# Patient Record
Sex: Male | Born: 1990 | Race: Black or African American | Hispanic: No | Marital: Single | Smoking: Never smoker
Health system: Southern US, Community
[De-identification: ages and names within clinical notes are randomized; demographics above are authoritative.]

## PROBLEM LIST (undated history)

## (undated) DIAGNOSIS — B019 Varicella without complication: Secondary | ICD-10-CM

## (undated) DIAGNOSIS — J45909 Unspecified asthma, uncomplicated: Secondary | ICD-10-CM

## (undated) HISTORY — PX: FOOT SURGERY: SHX648

## (undated) HISTORY — DX: Varicella without complication: B01.9

## (undated) HISTORY — DX: Unspecified asthma, uncomplicated: J45.909

---

## 2009-02-01 ENCOUNTER — Emergency Department (HOSPITAL_COMMUNITY): Admission: EM | Admit: 2009-02-01 | Discharge: 2009-02-01 | Payer: Self-pay | Admitting: Family Medicine

## 2009-03-10 ENCOUNTER — Encounter: Admission: RE | Admit: 2009-03-10 | Discharge: 2009-03-10 | Payer: Self-pay | Admitting: Family Medicine

## 2010-04-03 ENCOUNTER — Encounter: Payer: Self-pay | Admitting: Family Medicine

## 2010-06-29 ENCOUNTER — Inpatient Hospital Stay (INDEPENDENT_AMBULATORY_CARE_PROVIDER_SITE_OTHER)
Admission: RE | Admit: 2010-06-29 | Discharge: 2010-06-29 | Disposition: A | Discharge status: Home / Self Care | Payer: Federal, State, Local not specified - PPO | Source: Ambulatory Visit | Attending: Family Medicine | Admitting: Family Medicine

## 2010-06-29 DIAGNOSIS — J309 Allergic rhinitis, unspecified: Secondary | ICD-10-CM

## 2010-06-29 DIAGNOSIS — J9801 Acute bronchospasm: Secondary | ICD-10-CM

## 2010-11-21 ENCOUNTER — Inpatient Hospital Stay (INDEPENDENT_AMBULATORY_CARE_PROVIDER_SITE_OTHER)
Admission: RE | Admit: 2010-11-21 | Discharge: 2010-11-21 | Disposition: A | Discharge status: Home / Self Care | Payer: Federal, State, Local not specified - PPO | Source: Ambulatory Visit | Attending: Emergency Medicine | Admitting: Emergency Medicine

## 2010-11-21 DIAGNOSIS — N476 Balanoposthitis: Secondary | ICD-10-CM

## 2011-07-25 ENCOUNTER — Emergency Department (INDEPENDENT_AMBULATORY_CARE_PROVIDER_SITE_OTHER)
Admission: EM | Admit: 2011-07-25 | Discharge: 2011-07-25 | Disposition: A | Discharge status: Home / Self Care | Payer: Federal, State, Local not specified - PPO | Source: Home / Self Care | Attending: Emergency Medicine | Admitting: Emergency Medicine

## 2011-07-25 ENCOUNTER — Encounter (HOSPITAL_COMMUNITY): Payer: Self-pay | Admitting: *Deleted

## 2011-07-25 DIAGNOSIS — A084 Viral intestinal infection, unspecified: Secondary | ICD-10-CM

## 2011-07-25 DIAGNOSIS — A09 Infectious gastroenteritis and colitis, unspecified: Secondary | ICD-10-CM

## 2011-07-25 MED ORDER — ONDANSETRON 8 MG PO TBDP: 8.0000 mg | ORAL_TABLET | Freq: Three times a day (TID) | ORAL | Status: AC | PRN

## 2011-07-25 MED ORDER — BENZONATATE 200 MG PO CAPS: 200.0000 mg | ORAL_CAPSULE | Freq: Three times a day (TID) | ORAL | Status: AC | PRN

## 2011-07-25 MED ORDER — ONDANSETRON 4 MG PO TBDP
ORAL_TABLET | ORAL | Status: AC
  Filled 2011-07-25: qty 2

## 2011-07-25 MED ORDER — ONDANSETRON 4 MG PO TBDP
8.0000 mg | ORAL_TABLET | Freq: Once | ORAL | Status: AC
  Administered 2011-07-25: 8 mg via ORAL

## 2011-07-25 MED ORDER — DIPHENOXYLATE-ATROPINE 2.5-0.025 MG PO TABS: 1.0000 | ORAL_TABLET | Freq: Four times a day (QID) | ORAL | Status: AC | PRN

## 2011-07-25 NOTE — ED Provider Notes (Signed)
Chief Complaint  Patient presents with  . Diarrhea    History of Present Illness:   The patient is a 21 year old male who has had a three-day history of nausea and vomiting with clear vomitus. He's only vomited twice. He denies any blood in the vomitus, coffee-ground material, or bilious emesis. He's had frequent diarrheal stools. These were dark although he had taken some Pepto-Bismol before. No blood in the stools. He also notes a cough productive yellow sputum but no nasal congestion, rhinorrhea, or sore throat. He's had no fever or chills. He's had some crampy intermittent upper abdominal pain. He was exposed to his son this past Friday who had the same thing. He also ate a steak that was medium rare. No other suspicious exposures or ingestions. No recent travel. No recent antibiotics.  Review of Systems:  Other than noted above, the patient denies any of the following symptoms: Systemic:  No fevers, chills, sweats, weight loss or gain, fatigue, or tiredness. ENT:  No nasal congestion, rhinorrhea, or sore throat. Lungs:  No cough, wheezing, or shortness of breath. Cardiac:  No chest pain, syncope, or presyncope. GI:  No abdominal pain, nausea, vomiting, anorexia, diarrhea, constipation, blood in stool or vomitus. GU:  No dysuria, frequency, or urgency.  PMFSH:  Past medical history, family history, social history, meds, and allergies were reviewed.  Physical Exam:   Vital signs:  BP 127/92  Pulse 74  Temp(Src) 98.5 F (36.9 C) (Oral)  Resp 18  SpO2 98% General:  Alert and oriented.  In no distress.  Skin warm and dry.  Good skin turgor, brisk capillary refill. ENT:  No scleral icterus, moist mucous membranes, no oral lesions, pharynx clear. Lungs:  Breath sounds clear and equal bilaterally.  No wheezes, rales, or rhonchi. Heart:  Rhythm regular, without extrasystoles.  No gallops or murmers. Abdomen:  Abdomen was soft, flat, nondistended. There was no tenderness to palpation,  guarding, or rebound. Bowel sounds are hyperactive. No organomegaly or mass. Skin: Clear, warm, and dry.  Good turgor.  Brisk capillary refill.  Course in Urgent Care Center:   He was given Zofran ODT 8 mg sublingually and tolerated this well without any immediate side effects.  Assessment:  The encounter diagnosis was Viral gastroenteritis.  Plan:   1.  The following meds were prescribed:   New Prescriptions   BENZONATATE (TESSALON) 200 MG CAPSULE    Take 1 capsule (200 mg total) by mouth 3 (three) times daily as needed for cough.   DIPHENOXYLATE-ATROPINE (LOMOTIL) 2.5-0.025 MG PER TABLET    Take 1 tablet by mouth 4 (four) times daily as needed for diarrhea or loose stools.   ONDANSETRON (ZOFRAN ODT) 8 MG DISINTEGRATING TABLET    Take 1 tablet (8 mg total) by mouth every 8 (eight) hours as needed for nausea.   2.  The patient was instructed in symptomatic care and handouts were given. 3.  The patient was told to return if becoming worse in any way, if no better in 2 or 3 days, and given some red flag symptoms that would indicate earlier return. 4.  The patient was told to take only sips of clear liquids for the next 24 hours and then advance to a b.r.a.t. Diet.      Reuben Likes, MD 07/25/11 [phone]

## 2011-07-25 NOTE — ED Notes (Signed)
Pt  Reports   Symptoms  Of  Nausea  /  Vomiting  Diarrhea         X   2  Days           Pt  Reports  Symptoms  Of low  abd  Pain  Described  As  Cramping     At  Intervals  Too    -   He  Is  Ambulatory  To  Treatment  Area  Awake  And  Alert  And  or

## 2011-07-25 NOTE — Discharge Instructions (Signed)

## 2011-09-01 ENCOUNTER — Ambulatory Visit: Payer: Federal, State, Local not specified - PPO | Admitting: Family Medicine

## 2012-03-14 ENCOUNTER — Emergency Department (HOSPITAL_COMMUNITY): Payer: Federal, State, Local not specified - PPO

## 2012-03-14 ENCOUNTER — Encounter (HOSPITAL_COMMUNITY): Payer: Self-pay | Admitting: *Deleted

## 2012-03-14 ENCOUNTER — Emergency Department (HOSPITAL_COMMUNITY)
Admission: EM | Admit: 2012-03-14 | Discharge: 2012-03-14 | Disposition: A | Discharge status: Home / Self Care | Payer: Federal, State, Local not specified - PPO | Attending: Emergency Medicine | Admitting: Emergency Medicine

## 2012-03-14 DIAGNOSIS — R1013 Epigastric pain: Secondary | ICD-10-CM | POA: Insufficient documentation

## 2012-03-14 LAB — CBC WITH DIFFERENTIAL/PLATELET
Eosinophils Relative: 2 % (ref 0–5)
HCT: 43.7 % (ref 39.0–52.0)
Lymphocytes Relative: 14 % (ref 12–46)
Lymphs Abs: 1.4 10*3/uL (ref 0.7–4.0)
MCV: 83.4 fL (ref 78.0–100.0)
Monocytes Absolute: 0.6 10*3/uL (ref 0.1–1.0)
Neutro Abs: 8.2 10*3/uL — ABNORMAL HIGH (ref 1.7–7.7)
RBC: 5.24 MIL/uL (ref 4.22–5.81)
WBC: 10.4 10*3/uL (ref 4.0–10.5)

## 2012-03-14 LAB — COMPREHENSIVE METABOLIC PANEL
ALT: 22 U/L (ref 0–53)
AST: 20 U/L (ref 0–37)
BUN: 16 mg/dL (ref 6–23)
CO2: 25 mEq/L (ref 19–32)
Calcium: 9 mg/dL (ref 8.4–10.5)
Chloride: 103 mEq/L (ref 96–112)
Creatinine, Ser: 0.8 mg/dL (ref 0.50–1.35)
GFR calc Af Amer: >90 mL/min (ref >90)
GFR calc non Af Amer: >90 mL/min (ref >90)
Glucose, Bld: 107 mg/dL — ABNORMAL HIGH (ref 70–99)
Sodium: 140 mEq/L (ref 135–145)
Total Bilirubin: 0.3 mg/dL (ref 0.3–1.2)

## 2012-03-14 MED ORDER — ONDANSETRON HCL 4 MG/2ML IJ SOLN
INTRAMUSCULAR | Status: AC
  Filled 2012-03-14: qty 2

## 2012-03-14 MED ORDER — MORPHINE SULFATE 4 MG/ML IJ SOLN
4.0000 mg | Freq: Once | INTRAMUSCULAR | Status: AC
  Administered 2012-03-14: 4 mg via INTRAVENOUS
  Filled 2012-03-14: qty 1

## 2012-03-14 MED ORDER — GI COCKTAIL ~~LOC~~
30.0000 mL | Freq: Once | ORAL | Status: AC
  Administered 2012-03-14: 30 mL via ORAL
  Filled 2012-03-14: qty 30

## 2012-03-14 MED ORDER — OMEPRAZOLE 20 MG PO CPDR: 20.0000 mg | DELAYED_RELEASE_CAPSULE | Freq: Every day | ORAL | Status: DC

## 2012-03-14 MED ORDER — ONDANSETRON HCL 4 MG/2ML IJ SOLN
4.0000 mg | Freq: Once | INTRAMUSCULAR | Status: AC
  Administered 2012-03-14: 4 mg via INTRAVENOUS

## 2012-03-14 MED ORDER — ONDANSETRON 4 MG PO TBDP
4.0000 mg | ORAL_TABLET | Freq: Once | ORAL | Status: AC
  Administered 2012-03-14: 4 mg via ORAL

## 2012-03-14 MED ORDER — HYDROCODONE-ACETAMINOPHEN 5-325 MG PO TABS: 2.0000 | ORAL_TABLET | ORAL | Status: DC | PRN

## 2012-03-14 MED ORDER — ONDANSETRON 4 MG PO TBDP
ORAL_TABLET | ORAL | Status: AC
  Administered 2012-03-14: 4 mg via ORAL
  Filled 2012-03-14: qty 1

## 2012-03-14 MED ORDER — HYDROMORPHONE HCL PF 1 MG/ML IJ SOLN
0.5000 mg | Freq: Once | INTRAMUSCULAR | Status: DC
  Filled 2012-03-14: qty 1

## 2012-03-14 MED ORDER — ONDANSETRON HCL 4 MG/2ML IJ SOLN
4.0000 mg | Freq: Once | INTRAMUSCULAR | Status: DC
  Filled 2012-03-14: qty 2

## 2012-03-14 MED ORDER — FAMOTIDINE 20 MG PO TABS: 20.0000 mg | ORAL_TABLET | Freq: Two times a day (BID) | ORAL | Status: DC

## 2012-03-14 MED ORDER — HYDROMORPHONE HCL PF 1 MG/ML IJ SOLN
1.0000 mg | Freq: Once | INTRAMUSCULAR | Status: AC
  Administered 2012-03-14: 1 mg via INTRAMUSCULAR

## 2012-03-14 NOTE — ED Notes (Signed)
Pt sitting in room he was d/c from. Friend/family member states "he isn't better and he needs to be seen again".

## 2012-03-14 NOTE — ED Provider Notes (Signed)
History     CSN: [id number]  Arrival date & time 03/14/12  0701   First MD Initiated Contact with Patient 03/14/12 [phone]      Chief Complaint  Patient presents with  . Abdominal Pain    (Consider location/radiation/quality/duration/timing/severity/associated sxs/prior treatment) HPI  22 year old male presents complaining of epigastric abdominal pain. Patient reports he was awoke at 4 AM this morning with acute onset of sharp crampy sensation to his epigastrium, moderate in severity, intermittent lasting for minutes, non radiating, nothing makes it better or worse. Patient currently rates his pain as a 7/10. He denies fever, chills, sore throat, chest pain, shortness of breath, back pain, nausea, vomiting, diarrhea, dysuria, or rash. He denies taking any NSAIDs on a regular basis. Denies any hematochezia, or melena. He denies any appetite change, or prior history of biliary disease. Has never had similar symptoms like this before. Pt ate some sausage and squash the night before.  No recent alcohol use.  History reviewed. No pertinent past medical history.  Past Surgical History  Procedure Date  . Foot surgery     History reviewed. No pertinent family history.  History  Substance Use Topics  . Smoking status: Never Smoker   . Smokeless tobacco: Not on file  . Alcohol Use: Yes      Review of Systems  Constitutional:       A complete 10 system review of systems was obtained and all systems are negative except as noted in the HPI and PMH.    Allergies  Review of patient's allergies indicates no known allergies.  Home Medications  No current outpatient prescriptions on file.  BP 130/85  Pulse 72  Temp 97.8 F (36.6 C)  Resp 14  SpO2 98%  Physical Exam  Nursing note and vitals reviewed. Constitutional: He is oriented to person, place, and time. He appears well-developed and well-nourished. No distress.       Awake, alert, nontoxic appearance  HENT:  Head:  Atraumatic.  Eyes: Conjunctivae normal are normal. Right eye exhibits no discharge. Left eye exhibits no discharge.  Neck: Normal range of motion. Neck supple.  Cardiovascular: Normal rate and regular rhythm.   Pulmonary/Chest: Effort normal. No respiratory distress. He exhibits tenderness (mild epigastric tenderness on palpation without guarding or rebound tenderness. No hernia noted. No overlying skin changes noted.).       No CVA tenderness  Negative Murphy sign  No pain at McBurney's point  Abdominal: Soft. There is no tenderness. There is no rebound.  Musculoskeletal: He exhibits no tenderness.       ROM appears intact, no obvious focal weakness  Neurological: He is alert and oriented to person, place, and time.  Skin: Skin is warm and dry. No rash noted.  Psychiatric: He has a normal mood and affect.    ED Course  Procedures (including critical care time)   Labs Reviewed  CBC WITH DIFFERENTIAL  COMPREHENSIVE METABOLIC PANEL  LIPASE, BLOOD   No results found.   No diagnosis found.  Results for orders placed during the hospital encounter of 03/14/12  CBC WITH DIFFERENTIAL      Component Value Range   WBC 10.4  4.0 - 10.5 K/uL   RBC 5.24  4.22 - 5.81 MIL/uL   Hemoglobin 14.9  13.0 - 17.0 g/dL   HCT 11.9  14.7 - 82.9 %   MCV 83.4  78.0 - 100.0 fL   MCH 28.4  26.0 - 34.0 pg   MCHC 34.1  30.0 -  36.0 g/dL   RDW 11.9  14.7 - 82.9 %   Platelets 240  150 - 400 K/uL   Neutrophils Relative 78 (*) 43 - 77 %   Neutro Abs 8.2 (*) 1.7 - 7.7 K/uL   Lymphocytes Relative 14  12 - 46 %   Lymphs Abs 1.4  0.7 - 4.0 K/uL   Monocytes Relative 5  3 - 12 %   Monocytes Absolute 0.6  0.1 - 1.0 K/uL   Eosinophils Relative 2  0 - 5 %   Eosinophils Absolute 0.2  0.0 - 0.7 K/uL   Basophils Relative 0  0 - 1 %   Basophils Absolute 0.0  0.0 - 0.1 K/uL  COMPREHENSIVE METABOLIC PANEL      Component Value Range   Sodium 140  135 - 145 mEq/L   Potassium 4.0  3.5 - 5.1 mEq/L   Chloride 103   96 - 112 mEq/L   CO2 25  19 - 32 mEq/L   Glucose, Bld 107 (*) 70 - 99 mg/dL   BUN 16  6 - 23 mg/dL   Creatinine, Ser 5.62  0.50 - 1.35 mg/dL   Calcium 9.0  8.4 - 13.0 mg/dL   Total Protein 7.5  6.0 - 8.3 g/dL   Albumin 3.6  3.5 - 5.2 g/dL   AST 20  0 - 37 U/L   ALT 22  0 - 53 U/L   Alkaline Phosphatase 81  39 - 117 U/L   Total Bilirubin 0.3  0.3 - 1.2 mg/dL   GFR calc non Af Amer >90  >90 mL/min   GFR calc Af Amer >90  >90 mL/min  LIPASE, BLOOD      Component Value Range   Lipase 18  11 - 59 U/L   No results found.  1. Abdominal pain  MDM  Epigastric pain which started this AM. Pt appears nontoxic, vss. Work up initiated.  Low suspicion for biliary sxs at this time.   9:10 AM Labs are unremarkable.  Pt felt better after receiving treatment.  Will discharge with return precaution.  GI specialist referred as needed.  His sxs is likely GERD.  BP 130/85  Pulse 72  Temp 97.8 F (36.6 C)  Resp 14  SpO2 98%  I have reviewed nursing notes and vital signs.   I reviewed available ER/hospitalization records thought the EMR   10:01 AM Upon discharging pt, pt reports worsening epigastric abd pain. Pain has increased in severity.  On reexamination, non surgical abdomen, nondistented.  Pt did vomit once.  No prior hx of abd surgery.  Will obtain acute abdomen series to r/o obstruction, or lung pathology.    11:06 AM Acute abd series shows no acute finding.  Pt received pain medication and felt better.  Pt request discharge.  Pt is stable for discharge.    Fayrene Helper, PA-C 03/14/12 0911  Fayrene Helper, PA-C 03/14/12 1106

## 2012-03-14 NOTE — ED Notes (Signed)
Pt and belongings gone from room.

## 2012-03-14 NOTE — ED Provider Notes (Signed)
Medical screening examination/treatment/procedure(s) were performed by non-physician practitioner and as supervising physician I was immediately available for consultation/collaboration.   Joya Gaskins, MD 03/14/12 [phone]

## 2012-03-14 NOTE — ED Notes (Signed)
Pt states that he woke up with abdominal pain at 04:00. Pt states pain is upper abdomen. Pt states constant pain with intermittant increase in pain. Pt states sharp in nature. Pt denies N/V/D.

## 2012-03-14 NOTE — ED Notes (Signed)
Pt requested d/c after receiving IM dilaudid.

## 2012-03-14 NOTE — ED Notes (Signed)
Pt lying on stretcher watching TV talking to friend/family. NAD.

## 2013-01-31 ENCOUNTER — Ambulatory Visit (INDEPENDENT_AMBULATORY_CARE_PROVIDER_SITE_OTHER): Payer: Federal, State, Local not specified - PPO

## 2013-01-31 VITALS — BP 119/67 | HR 63 | Resp 18

## 2013-01-31 DIAGNOSIS — Q6650 Congenital pes planus, unspecified foot: Secondary | ICD-10-CM

## 2013-01-31 DIAGNOSIS — M79609 Pain in unspecified limb: Secondary | ICD-10-CM

## 2013-01-31 DIAGNOSIS — M7750 Other enthesopathy of unspecified foot: Secondary | ICD-10-CM

## 2013-01-31 DIAGNOSIS — M76829 Posterior tibial tendinitis, unspecified leg: Secondary | ICD-10-CM

## 2013-01-31 DIAGNOSIS — M722 Plantar fascial fibromatosis: Secondary | ICD-10-CM

## 2013-01-31 NOTE — Progress Notes (Signed)
°  Subjective:    Patient ID: Lawrence Yates, male    DOB: 07/24/1990, 22 y.o.   MRN: [id number]  HPI Pain in both feet and had surgery on left foot surgery in 2004 and i lost my brace on my left foot and throbbing and no swelling and hurts on bottom and on inside   Review of Systems  Constitutional: Negative.   HENT: Negative.   Eyes: Negative.   Respiratory: Negative.   Cardiovascular: Negative.   Gastrointestinal: Negative.   Endocrine: Negative.   Genitourinary: Negative.   Musculoskeletal: Negative.   Skin: Negative.   Allergic/Immunologic: Negative.   Neurological: Negative.   Hematological: Negative.   Psychiatric/Behavioral: Negative.        Objective:   Physical Exam Neurovascular status is intact bilateral with pedal pulses palpable DP postal for PT +2/4 bilateral. Capillary fill time 3 seconds all digits. Epicritic and proprioceptive sensations intact and symmetric bilateral. Neurologically skin color pigment and hair growth are normal orthopedic exam reveals promontory changes left more so than right. Patient had previous surgery of his left foot including an evidence cotton and tender procedure left foot continues on the graft and clinically to have promontory changes. Patient is currently wearing a pair of slip on type slide  sandals does not wear his orthoses. Also indicates he has aggravation of his medial arch and medial ankle area with workout activities. He works out utilizing a [employer] not certain of the model. X-rays detect decreased calcaneal inclination with increased talar declination angle. Subtalar joint show some arthrosis. 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 There is some hammertoe deformities fifth bilateral beginnings decreased calcaneal cuboid angle left more so than right no fractures or other osseous abnormalities are noted. Clinically there is some weakness in the posterior tibial active passive range of motion exercises  bilateral left more so than right tenderness along palpation of the posterior tibial tendon at its insertion and along Magan plantar fascia left more so than right.     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 Cannot rule plantar fascial symptomology as well. 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 At this time may recommendations to resume using orthoses better athletic or walking shoes such as new balance or Shon [place]aton are recommended avoid barefoot or flimsy shoes or flip-flops. Suggested over-the-counter NSAID such as ibuprofen also ice to the affected tendon areas. Reappointed with the next month for followup for possible orthotics reevaluation adjustments he will bring those with him at next visit. Also suggested and dispensed for patient request to new ankle stabilizer is to help with the posterior tibial tendon weakness. Followup with in one month if needed.  Alvan Dame DPM

## 2013-01-31 NOTE — Patient Instructions (Signed)

## 2013-06-27 ENCOUNTER — Ambulatory Visit: Payer: Federal, State, Local not specified - PPO

## 2014-10-13 ENCOUNTER — Encounter (INDEPENDENT_AMBULATORY_CARE_PROVIDER_SITE_OTHER): Payer: Self-pay

## 2014-10-13 ENCOUNTER — Encounter: Payer: Self-pay | Admitting: Primary Care

## 2014-10-13 ENCOUNTER — Ambulatory Visit (INDEPENDENT_AMBULATORY_CARE_PROVIDER_SITE_OTHER): Payer: Federal, State, Local not specified - PPO | Admitting: Primary Care

## 2014-10-13 VITALS — BP 124/74 | HR 70 | Temp 97.6°F | Ht 69.0 in | Wt 277.0 lb

## 2014-10-13 DIAGNOSIS — Z0000 Encounter for general adult medical examination without abnormal findings: Secondary | ICD-10-CM | POA: Diagnosis not present

## 2014-10-13 DIAGNOSIS — Z111 Encounter for screening for respiratory tuberculosis: Secondary | ICD-10-CM

## 2014-10-13 DIAGNOSIS — J4520 Mild intermittent asthma, uncomplicated: Secondary | ICD-10-CM

## 2014-10-13 DIAGNOSIS — J45909 Unspecified asthma, uncomplicated: Secondary | ICD-10-CM | POA: Insufficient documentation

## 2014-10-13 NOTE — Assessment & Plan Note (Signed)
Exercise induced and present with winter weather.  Will use albuterol inhaler once to twice in winter time. Lungs clear on exam. Will continue to monitor.

## 2014-10-13 NOTE — Progress Notes (Signed)
Subjective:    Patient ID: Lawrence Yates, male    DOB: 1990-10-22, 24 y.o.   MRN: [id number]  HPI  Lawrence Yates is a 24 year old male who presents today to establish care and discuss the problems mentioned below. Will obtain old records.  1) Asthma: Diagnosed one year ago and is mostly with exercise and cold weather. He has an albuterol inhaler at home and will use this in the winter months. He does not require his inhaler frequently.   Immunizations: -Tetanus: Completed 2-3 years ago per patient. -Influenza: Did not receive last season.   Diet: Endorses a poor diet. Breakfast: Skips Lunch: Will eat in the school cafeteria (meat, fruit, vegetable) Dinner: Eats out 4-5 days weekly ([place], [place], [place], etc.) Beverages: Water, occasional soda and sweet tea. Exercise: He is not currently exercising.  Eye exam: Completed in May 2016. Dental exam: Completed 6 months ago.   Review of Systems  Constitutional: Negative for unexpected weight change.  HENT: Negative for rhinorrhea.   Respiratory: Negative for cough and shortness of breath.   Cardiovascular: Negative for chest pain.  Gastrointestinal: Negative for diarrhea and constipation.  Genitourinary: Negative for difficulty urinating.  Musculoskeletal: Negative for myalgias and arthralgias.  Skin: Negative for rash.  Allergic/Immunologic: Positive for environmental allergies.  Neurological: Negative for dizziness, numbness and headaches.  Psychiatric/Behavioral:       Denies concerns for anxiety or depression       Past Medical History  Diagnosis Date  . Asthma     execrise induced  . Chicken pox     History   Social History  . Marital Status: Single    Spouse Name: N/A  . Number of Children: N/A  . Years of Education: N/A   Occupational History  . Not on file.   Social History Main Topics  . Smoking status: Never Smoker   . Smokeless tobacco: Never Used  . Alcohol Use: 0.0 oz/week    0 Standard  drinks or equivalent per week     Comment: socially  . Drug Use: No  . Sexual Activity: Not on file   Other Topics Concern  . Not on file   Social History Narrative   Married.   1 son.   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   Enjoys playing sports    Past Surgical History  Procedure Laterality Date  . Foot surgery    . Foot surgery      left foot    Family History  Problem Relation Age of Onset  . Hypertension Mother   . Diabetes Father     No Known Allergies  No current outpatient prescriptions on file prior to visit.   No current facility-administered medications on file prior to visit.    BP 124/74 mmHg  Pulse 70  Temp(Src) 97.6 F (36.4 C) (Oral)  Ht  (1.753 m)  Wt 277 lb (125.646 kg)  BMI 40.89 kg/m2  SpO2 98%    Objective:   Physical Exam  Constitutional: He is oriented to person, place, and time. He appears well-nourished.  HENT:  Right Ear: Tympanic membrane and ear canal normal.  Left Ear: Tympanic membrane and ear canal normal.  Nose: Nose normal.  Mouth/Throat: Oropharynx is clear and moist.  Eyes: Conjunctivae and EOM are normal. Pupils are equal, round, and reactive to light.  Neck: Neck supple.  Cardiovascular: Normal rate and regular rhythm.   Pulmonary/Chest: Effort normal and breath sounds normal.  Abdominal: Soft.  Bowel sounds are normal. There is no tenderness.  Musculoskeletal: Normal range of motion.  Lymphadenopathy:    He has no cervical adenopathy.  Neurological: He is alert and oriented to person, place, and time. He has normal reflexes. No cranial nerve deficit.  Skin: Skin is warm and dry.  Psychiatric: He has a normal mood and affect.          Assessment & Plan:

## 2014-10-13 NOTE — Patient Instructions (Signed)
Schedule a lab only appointment for Thursday or Friday this week.  Have your TB test read no sooner than Thursday before 4pm and no later than Friday after 4pm.  It was a pleasure to meet you today! Please don't hesitate to call me with any questions. Welcome to [facility]!

## 2014-10-13 NOTE — Progress Notes (Signed)
Pre visit review using our clinic review tool, if applicable. No additional management support is needed unless otherwise documented below in the visit note. 

## 2014-10-13 NOTE — Assessment & Plan Note (Signed)
Present with form requiring physical and TB test for work. UTD with immunizations. Exam unremarkable. He will obtain labs this Friday as he is not fasting. Discussed importance of healthy diet and exercise. He will return between 4pm on Thursday and 4pm on Friday for TB test read. Follow up in 1 year if labs are okay.

## 2014-10-16 ENCOUNTER — Other Ambulatory Visit (INDEPENDENT_AMBULATORY_CARE_PROVIDER_SITE_OTHER): Payer: Federal, State, Local not specified - PPO

## 2014-10-16 DIAGNOSIS — Z0000 Encounter for general adult medical examination without abnormal findings: Secondary | ICD-10-CM | POA: Diagnosis not present

## 2014-10-16 LAB — COMPREHENSIVE METABOLIC PANEL
ALBUMIN: 4.1 g/dL (ref 3.5–5.2)
ALK PHOS: 74 U/L (ref 39–117)
ALT: 19 U/L (ref 0–53)
AST: 18 U/L (ref 0–37)
BUN: 16 mg/dL (ref 6–23)
CALCIUM: 9.1 mg/dL (ref 8.4–10.5)
CO2: 28 mEq/L (ref 19–32)
Chloride: 104 mEq/L (ref 96–112)
Creatinine, Ser: 0.9 mg/dL (ref 0.40–1.50)
GFR: 131.63 mL/min (ref >60.00)
Glucose, Bld: 95 mg/dL (ref 70–99)
Potassium: 4 mEq/L (ref 3.5–5.1)
SODIUM: 138 meq/L (ref 135–145)
Total Bilirubin: 0.5 mg/dL (ref 0.2–1.2)
Total Protein: 7.4 g/dL (ref 6.0–8.3)

## 2014-10-16 LAB — LIPID PANEL
CHOLESTEROL: 183 mg/dL (ref 0–200)
HDL: 37.9 mg/dL — ABNORMAL LOW (ref >39.00)
LDL CALC: 128 mg/dL — AB (ref 0–99)
NONHDL: 145.21
Total CHOL/HDL Ratio: 5
Triglycerides: 84 mg/dL (ref 0.0–149.0)
VLDL: 16.8 mg/dL (ref 0.0–40.0)

## 2014-10-16 LAB — TB SKIN TEST
INDURATION: 0 mm
TB SKIN TEST: NEGATIVE

## 2014-10-16 LAB — CBC
HEMATOCRIT: 42.9 % (ref 39.0–52.0)
Hemoglobin: 14.4 g/dL (ref 13.0–17.0)
MCHC: 33.6 g/dL (ref 30.0–36.0)
MCV: 85.4 fl (ref 78.0–100.0)
PLATELETS: 296 10*3/uL (ref 150.0–400.0)
RBC: 5.03 Mil/uL (ref 4.22–5.81)
RDW: 13.9 % (ref 11.5–15.5)
WBC: 8.5 10*3/uL (ref 4.0–10.5)

## 2014-10-16 LAB — HEMOGLOBIN A1C: HEMOGLOBIN A1C: 5.8 % (ref 4.6–6.5)

## 2014-10-19 ENCOUNTER — Encounter: Payer: Self-pay | Admitting: *Deleted

## 2014-11-03 ENCOUNTER — Telehealth: Payer: Self-pay

## 2014-11-03 NOTE — Telephone Encounter (Signed)
Pt left v/m requesting refill of med given by previous physician; no meds on med list and pt did not leave name of med in v/m. Pt seen on 10/13/14 to establish care. Left v/m requesting cb.

## 2014-11-06 NOTE — Telephone Encounter (Signed)
Pt said he used to take med for ADD and wants to restart med but does not know name of medication. Pt will get name of med and cb. Advised pt may need appt to be seen to discuss ADD; pt said was just seen 10/13/14 but does not think ADD was discussed. Pt will cb.

## 2014-11-10 NOTE — Telephone Encounter (Signed)
Pt left v/m requesting cb about med; left v/m requesting cb.

## 2014-11-11 ENCOUNTER — Telehealth: Payer: Self-pay | Admitting: Primary Care

## 2014-11-11 NOTE — Telephone Encounter (Signed)
Pt called back and name of med requesting rx is ampheta-dextro; pt does not know mg. Advised pt will send note at his request because pt does not want to come back for another appt to get med. Advised pt since did not discuss ADD at 10/13/14 appt pt may need to schedule another appt to get ADD rx. Pt request cb.

## 2014-11-11 NOTE — Telephone Encounter (Signed)
Left v/m requesting cb from pt. 

## 2014-11-11 NOTE — Telephone Encounter (Signed)
We did not discuss his ADD during the last appointment. If he is interested in going back on his medication I would like him formally tested first. He may contact Dr. Mariane Masters office at [phone] to set up testing. They will be able to advise on medication. They are located at [address] [place], [place] [address] Thanks.

## 2014-11-12 NOTE — Telephone Encounter (Signed)
I left a message for the patient to return my call.

## 2014-11-12 NOTE — Telephone Encounter (Signed)
Patient called back. Notified patient of Kate's comments. Patient verbalized understanding.  However, patient stated that he is almost of his medication.   Notified Jae Dire and she wanted him to come in. Patient has apt on 11/18/14 at 4 pm.

## 2014-11-18 ENCOUNTER — Ambulatory Visit: Payer: Federal, State, Local not specified - PPO | Admitting: Primary Care

## 2014-11-25 ENCOUNTER — Encounter: Payer: Self-pay | Admitting: Radiology

## 2014-11-25 ENCOUNTER — Ambulatory Visit (INDEPENDENT_AMBULATORY_CARE_PROVIDER_SITE_OTHER): Payer: Federal, State, Local not specified - PPO | Admitting: Primary Care

## 2014-11-25 ENCOUNTER — Encounter: Payer: Self-pay | Admitting: Primary Care

## 2014-11-25 VITALS — BP 136/76 | HR 69 | Temp 98.5°F | Ht 69.0 in | Wt 271.4 lb

## 2014-11-25 DIAGNOSIS — R41840 Attention and concentration deficit: Secondary | ICD-10-CM | POA: Diagnosis not present

## 2014-11-25 DIAGNOSIS — F909 Attention-deficit hyperactivity disorder, unspecified type: Secondary | ICD-10-CM | POA: Insufficient documentation

## 2014-11-25 MED ORDER — AMPHETAMINE-DEXTROAMPHETAMINE 10 MG PO TABS: 10.0000 mg | ORAL_TABLET | Freq: Every day | ORAL | Status: DC

## 2014-11-25 NOTE — Assessment & Plan Note (Signed)
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 Would like refill of meds as he's done well with studying and would like to continue now that he's in graduate school. He's had no formal testing. 1 refill provided today with instructions for patient to be formally tested in order to continue medication. UDS and controlled substance contract obtained. Information provided regarding testing services.

## 2014-11-25 NOTE — Progress Notes (Signed)
Pre visit review using our clinic review tool, if applicable. No additional management support is needed unless otherwise documented below in the visit note. 

## 2014-11-25 NOTE — Patient Instructions (Addendum)
You must get tested for ADD/ADHD in order to continue this medication. Call and schedule an appointment with the information provided. I have provided a refill today of your medication.  Stop by the lab for the urine drug screen and contract.  It was a pleasure to see you today!

## 2014-11-25 NOTE — Progress Notes (Signed)
   Subjective:    Patient ID: Lawrence Yates, male    DOB: 1990-05-20, 24 y.o.   MRN: [id number]  HPI  Lawrence Yates is a 24 year old male who presents today for medication refill. He was evaluated as a new patient on 10/13/2014. He called several days after his initial visit requesting a refill on his adderall. He had not mentioned being on this medication previously so an office visit was set up to discuss.  He is currently managed on Adderall 10 mg tablets intermittently for the past 4-5 months. Initiated by prior PCP. He has historically taken his medication as needed but recently will take his medication once daily in the morning. 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 When off of his medication he has difficulty concentrating, difficulty studying. He's had no formal testing. Denies palpitations, chest pain.  Review of Systems  Respiratory: Negative for shortness of breath.   Cardiovascular: Negative for chest pain.  Neurological: Negative for headaches.  Psychiatric/Behavioral: Positive for decreased concentration. Negative for suicidal ideas and sleep disturbance.       Past Medical History  Diagnosis Date  . Asthma     execrise induced  . Chicken pox     Social History   Social History  . Marital Status: Single    Spouse Name: N/A  . Number of Children: N/A  . Years of Education: N/A   Occupational History  . Not on file.   Social History Main Topics  . Smoking status: Never Smoker   . Smokeless tobacco: Never Used  . Alcohol Use: 0.0 oz/week    0 Standard drinks or equivalent per week     Comment: socially  . Drug Use: No  . Sexual Activity: Not on file   Other Topics Concern  . Not on file   Social History Narrative   Married.   1 son.   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   Enjoys playing sports    Past Surgical History  Procedure Laterality Date  . Foot surgery    .  Foot surgery      left foot    Family History  Problem Relation Age of Onset  . Hypertension Mother   . Diabetes Father     No Known Allergies  No current outpatient prescriptions on file prior to visit.   No current facility-administered medications on file prior to visit.    BP 136/76 mmHg  Pulse 69  Temp(Src) 98.5 F (36.9 C) (Oral)  Ht  (1.753 m)  Wt 271 lb 6.4 oz (123.106 kg)  BMI 40.06 kg/m2  SpO2 96%    Objective:   Physical Exam  Constitutional: He appears well-nourished.  Cardiovascular: Normal rate and regular rhythm.   Pulmonary/Chest: Effort normal and breath sounds normal.  Skin: Skin is warm and dry.  Psychiatric: He has a normal mood and affect.          Assessment & Plan:

## 2014-12-14 ENCOUNTER — Encounter: Payer: Self-pay | Admitting: Primary Care

## 2014-12-23 ENCOUNTER — Ambulatory Visit (INDEPENDENT_AMBULATORY_CARE_PROVIDER_SITE_OTHER): Payer: Federal, State, Local not specified - PPO | Admitting: Psychology

## 2014-12-23 DIAGNOSIS — F909 Attention-deficit hyperactivity disorder, unspecified type: Secondary | ICD-10-CM | POA: Diagnosis not present

## 2015-01-05 ENCOUNTER — Telehealth: Payer: Self-pay | Admitting: *Deleted

## 2015-01-05 NOTE — Telephone Encounter (Addendum)
Called and spoken to patient. Patient stated he was seen on 12/24/2014. Inform the patient that he will need to call Behavioral Health and have them send the records before any changes on medications. Patient verbalized understanding.

## 2015-01-05 NOTE — Telephone Encounter (Signed)
I [facility], when did he get tested? Will you obtain these records for me please? Thanks.

## 2015-01-05 NOTE — Telephone Encounter (Signed)
Spoke to pt who states that he has seen behavorial med for assessment, but is currently taking adderall, but it is not effective. He states he is "still having trouble" and is wanting increase in dose, if possible. pls advise

## 2015-01-08 ENCOUNTER — Other Ambulatory Visit: Payer: Self-pay | Admitting: Primary Care

## 2015-01-08 ENCOUNTER — Telehealth: Payer: Self-pay | Admitting: Primary Care

## 2015-01-08 DIAGNOSIS — F900 Attention-deficit hyperactivity disorder, predominantly inattentive type: Secondary | ICD-10-CM

## 2015-01-08 MED ORDER — ATOMOXETINE HCL 40 MG PO CAPS: 40.0000 mg | ORAL_CAPSULE | Freq: Every day | ORAL | Status: DC

## 2015-01-08 NOTE — Telephone Encounter (Signed)
Spoke with patient. Adderall 10 mg is not helpful with ADHD symptoms. He was evaluated by Dr. 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 RX for Strattera 40 mg daily sent to pharmacy. He is to follow up with me in 1 month. Johny DrillingChan, will you please schedule? Thanks.

## 2015-01-08 NOTE — Telephone Encounter (Signed)
Pt returned your call. He says you already have the best number to call back Thank you

## 2015-01-11 NOTE — Telephone Encounter (Signed)
Called patient and schedule follow up on 02/09/2015.  [facility] [address] [facility] [place] request prior auth for Strattera 40 mg.  Notified patient of processing prior auth for the Rx.  Sent PA on 01/11/2015.

## 2015-01-13 ENCOUNTER — Telehealth: Payer: Self-pay | Admitting: Primary Care

## 2015-01-13 DIAGNOSIS — F900 Attention-deficit hyperactivity disorder, predominantly inattentive type: Secondary | ICD-10-CM

## 2015-01-13 MED ORDER — BUPROPION HCL ER (XL) 150 MG PO TB24: 150.0000 mg | ORAL_TABLET | ORAL | Status: DC

## 2015-01-13 NOTE — Telephone Encounter (Signed)
Prior Auth was denied on 01/13/2015. Placed the denial letter from [employer] and [employer] [place] in BlueLinxKate's inbox.

## 2015-01-13 NOTE — Telephone Encounter (Signed)
Please notify Mr. Lawrence Yates that I've sent in Bupropion ER 150 mg tablets to his pharmacy for the ADHD. He is to take 1 tablet by mouth every morning. Will you please reschedule him for follow up during the week of December 7th? I want to ensure this medication has had time to work before I re-evaluate him. Thanks.

## 2015-01-14 NOTE — Telephone Encounter (Signed)
Called and notified patient of Lawrence Yates's comments. Patient verbalized understanding. Patient hang up before finishing the call due noise in the background.

## 2015-01-18 ENCOUNTER — Telehealth: Payer: Self-pay | Admitting: Primary Care

## 2015-01-18 NOTE — Telephone Encounter (Signed)
Yes, this medication is used "off-label" for ADHD, as well as for depression and smoking cessation. Since he hasn't started any medication at this point I will need to push back his follow up date to the first week in December.

## 2015-01-18 NOTE — Telephone Encounter (Signed)
Pt did not pick up bupropion because pharmacist told pt med is usually for stop smoking and depression. Pt wants to verify this is the med Jae DireKate wants pt to take. Pt request cb.

## 2015-01-20 NOTE — Telephone Encounter (Signed)
Message left for patient to return my call.  

## 2015-01-20 NOTE — Telephone Encounter (Signed)
Message left for patient to return my call on 01/19/2015.

## 2015-01-20 NOTE — Telephone Encounter (Signed)
Patient called back. Notified patient of Kate's comments. Patient stated that he is busy at the moment and will call back to reschedule.

## 2015-02-03 ENCOUNTER — Encounter: Payer: Self-pay | Admitting: Primary Care

## 2015-02-03 ENCOUNTER — Ambulatory Visit (INDEPENDENT_AMBULATORY_CARE_PROVIDER_SITE_OTHER): Payer: Federal, State, Local not specified - PPO | Admitting: Primary Care

## 2015-02-03 VITALS — BP 136/84 | HR 83 | Temp 97.4°F | Ht 69.0 in | Wt 273.0 lb

## 2015-02-03 DIAGNOSIS — F900 Attention-deficit hyperactivity disorder, predominantly inattentive type: Secondary | ICD-10-CM | POA: Diagnosis not present

## 2015-02-03 MED ORDER — AMPHETAMINE-DEXTROAMPHETAMINE 10 MG PO TABS: 10.0000 mg | ORAL_TABLET | Freq: Every day | ORAL | Status: DC

## 2015-02-03 NOTE — Patient Instructions (Signed)
Start Adderall 10 mg once daily at 12pm.  Keep me updated with your progress.  It was a pleasure to see you today!

## 2015-02-03 NOTE — Progress Notes (Signed)
   Subjective:    Patient ID: Lawrence Yates, male    DOB: January 27, 1991, 24 y.o.   MRN: [id number]  HPI  Mr. Lawrence Yates is a 24 year old male who presents today for follow up of ADHD. He was evaluated by Dr. Reggy EyeAltabet in early/mid October and was diagnosed with ADHD inattentive presentation. He was once managed on Adderall 10 mg once daily that was initiated by prior PCP before he was formally evaluated. He called in on 01/05/15 reporting that the adderall was no longer effective. He was sent in an RX for Strattera 40 mg which was not covered by his insurance. He was then trailed on Wellbutrin XL 150 mg daily.   He's been taking the Wellbutrin for 2-3 weeks. Since initiate of Wellbutrin he's noticed feeling more "moody" with less energy and tired. He reports that the adderall 10 mg helped him concentrate but it was hard to get started. He typically took the adderall 3-4 times weekly in the afternoon.   Review of Systems  Respiratory: Negative for shortness of breath.   Cardiovascular: Negative for chest pain and palpitations.  Neurological: Negative for headaches.  Psychiatric/Behavioral: Positive for decreased concentration. Negative for suicidal ideas.       Past Medical History  Diagnosis Date  . Asthma     execrise induced  . Chicken pox     Social History   Social History  . Marital Status: Single    Spouse Name: N/A  . Number of Children: N/A  . Years of Education: N/A   Occupational History  . Not on file.   Social History Main Topics  . Smoking status: Never Smoker   . Smokeless tobacco: Never Used  . Alcohol Use: 0.0 oz/week    0 Standard drinks or equivalent per week     Comment: socially  . Drug Use: No  . Sexual Activity: Not on file   Other Topics Concern  . Not on file   Social History Narrative   Married.   1 son.   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   Enjoys playing sports    Past Surgical History  Procedure Laterality Date  . Foot surgery     . Foot surgery      left foot    Family History  Problem Relation Age of Onset  . Hypertension Mother   . Diabetes Father     No Known Allergies  Current Outpatient Prescriptions on File Prior to Visit  Medication Sig Dispense Refill  . buPROPion (WELLBUTRIN XL) 150 MG 24 hr tablet Take 1 tablet (150 mg total) by mouth every morning. (Patient not taking: Reported on 02/03/2015) 30 tablet 2   No current facility-administered medications on file prior to visit.    BP 136/84 mmHg  Pulse 83  Temp(Src) 97.4 F (36.3 C) (Oral)  Ht 5\' 9"  (1.753 m)  Wt 273 lb (123.832 kg)  BMI 40.30 kg/m2  SpO2 94%    Objective:   Physical Exam  Constitutional: He appears well-nourished.  Cardiovascular: Normal rate and regular rhythm.   Pulmonary/Chest: Effort normal and breath sounds normal.  Skin: Skin is warm and dry.  Psychiatric: He has a normal mood and affect.          Assessment & Plan:

## 2015-02-03 NOTE — Assessment & Plan Note (Signed)
No improvement with Wellbutrin XL. He is interested in trying Adderall again as he was taking it 2-3 times weekly.  New RX for Adderall 10 mg daily printed with instructions to take every day. He is to update me in a month.

## 2015-02-03 NOTE — Progress Notes (Signed)
Pre visit review using our clinic review tool, if applicable. No additional management support is needed unless otherwise documented below in the visit note. 

## 2015-02-09 ENCOUNTER — Ambulatory Visit: Payer: Federal, State, Local not specified - PPO | Admitting: Primary Care

## 2015-03-02 ENCOUNTER — Telehealth: Payer: Self-pay

## 2015-03-02 NOTE — Telephone Encounter (Signed)
Pt called back and he wants a report from behaviorial health; advised pt he would need to contact behaviorial health for that information. Pt voiced understanding and nothing further needed.

## 2015-03-02 NOTE — Telephone Encounter (Signed)
Pt left v/m requesting refill of med but did not leave name of medication;left v/m requesting pt to cb.

## 2015-03-11 ENCOUNTER — Other Ambulatory Visit: Payer: Self-pay

## 2015-03-11 ENCOUNTER — Other Ambulatory Visit: Payer: Self-pay | Admitting: Primary Care

## 2015-03-11 DIAGNOSIS — F900 Attention-deficit hyperactivity disorder, predominantly inattentive type: Secondary | ICD-10-CM

## 2015-03-11 DIAGNOSIS — J45990 Exercise induced bronchospasm: Secondary | ICD-10-CM

## 2015-03-11 MED ORDER — ALBUTEROL SULFATE HFA 108 (90 BASE) MCG/ACT IN AERS: INHALATION_SPRAY | RESPIRATORY_TRACT | Status: DC

## 2015-03-11 MED ORDER — AMPHETAMINE-DEXTROAMPHETAMINE 10 MG PO TABS: 10.0000 mg | ORAL_TABLET | Freq: Every day | ORAL | Status: DC

## 2015-03-11 NOTE — Telephone Encounter (Signed)
Message left advising patient and Rx placed up front for pick up. 

## 2015-03-11 NOTE — Telephone Encounter (Signed)
Pt left v/m requesting rx for Adderall. Call when ready for pick up.pt last seen and rx last printed # 30 on 02/03/15. Pt thought refill was going to be called in [facility]ol [facility] [place]; last I can see inhaler discussed was when established 10/13/14. Please advise.

## 2015-03-26 ENCOUNTER — Telehealth: Payer: Self-pay

## 2015-03-26 NOTE — Telephone Encounter (Signed)
Called and notified [facility] that I am waiting on response of the PA

## 2015-03-26 NOTE — Telephone Encounter (Signed)
Lawrence Yates with [facility] [place] left v/m requesting status of PA for Adderall.

## 2015-03-31 NOTE — Telephone Encounter (Signed)
Because of patient has a State PPO policy ([employer] pre fix), all pharmacy inquiries are handled directly through [facility]. Sent to PA and waiting for response. Called and notified patient that I am waiting on the insurance.

## 2015-03-31 NOTE — Telephone Encounter (Signed)
Pt left v/m requesting cb about status of adderall PA. 

## 2015-04-01 NOTE — Telephone Encounter (Signed)
Kayla with [facility] [place] left v/m requesting status of Adderall PA.

## 2015-04-02 NOTE — Telephone Encounter (Signed)
[facility] Confirmed approvel of the PA. Prescription has been filled.  Left voicemail for patient to call back.  PA for Amphetamine-Dextroamphet ER 10 mg has been approved for time period of 03/03/2015-04/01/18 (PA# [facility] Plan [id number] Non-Grandfathered [id number])

## 2015-04-20 ENCOUNTER — Telehealth: Payer: Self-pay | Admitting: Primary Care

## 2015-04-20 ENCOUNTER — Other Ambulatory Visit: Payer: Self-pay | Admitting: Primary Care

## 2015-04-20 DIAGNOSIS — F900 Attention-deficit hyperactivity disorder, predominantly inattentive type: Secondary | ICD-10-CM

## 2015-04-20 MED ORDER — AMPHETAMINE-DEXTROAMPHETAMINE 10 MG PO TABS: 10.0000 mg | ORAL_TABLET | Freq: Every day | ORAL | Status: DC

## 2015-04-20 MED ORDER — AMPHETAMINE-DEXTROAMPHETAMINE 10 MG PO TABS: 10.0000 mg | ORAL_TABLET | Freq: Two times a day (BID) | ORAL | Status: DC

## 2015-04-20 NOTE — Telephone Encounter (Signed)
Called and notified patient of Kate's comments. Left in the front office.Patient stated that he does noticed that it wears off during the day.

## 2015-04-20 NOTE — Telephone Encounter (Signed)
Spoke with patient regarding adderall dosing. He is to start taking twice daily. Prescription updated and placed up front for pick up. He is to update me in 1 month.

## 2015-05-19 ENCOUNTER — Other Ambulatory Visit: Payer: Self-pay | Admitting: Primary Care

## 2015-05-19 DIAGNOSIS — F900 Attention-deficit hyperactivity disorder, predominantly inattentive type: Secondary | ICD-10-CM

## 2015-05-19 MED ORDER — AMPHETAMINE-DEXTROAMPHETAMINE 10 MG PO TABS: 10.0000 mg | ORAL_TABLET | Freq: Two times a day (BID) | ORAL | Status: DC

## 2015-05-20 ENCOUNTER — Telehealth: Payer: Self-pay | Admitting: Primary Care

## 2015-05-20 NOTE — Telephone Encounter (Signed)
Message left for patient to return my call.  

## 2015-05-20 NOTE — Telephone Encounter (Signed)
Called patient and notified that Rx is ready for pick. He also stated that he is doing good with the current dosage.

## 2015-05-20 NOTE — Telephone Encounter (Signed)
Patient returned Chan's call. °

## 2015-05-20 NOTE — Telephone Encounter (Signed)
Called and notified patient of Kate's comments. Patient verbalized understanding.  

## 2015-07-15 ENCOUNTER — Other Ambulatory Visit: Payer: Self-pay | Admitting: Primary Care

## 2015-07-15 DIAGNOSIS — F900 Attention-deficit hyperactivity disorder, predominantly inattentive type: Secondary | ICD-10-CM

## 2015-07-16 MED ORDER — AMPHETAMINE-DEXTROAMPHETAMINE 10 MG PO TABS: 10.0000 mg | ORAL_TABLET | Freq: Two times a day (BID) | ORAL | Status: DC

## 2015-07-20 ENCOUNTER — Encounter: Payer: Self-pay | Admitting: *Deleted

## 2015-07-20 NOTE — Telephone Encounter (Signed)
Message left for patient to return my call on 07/19/2015. Sent patient a message thru MyChart that Rx is ready for pick up.

## 2015-08-27 ENCOUNTER — Encounter: Payer: Self-pay | Admitting: Family Medicine

## 2015-08-27 ENCOUNTER — Ambulatory Visit (INDEPENDENT_AMBULATORY_CARE_PROVIDER_SITE_OTHER): Payer: BC Managed Care – PPO | Admitting: Family Medicine

## 2015-08-27 VITALS — BP 102/72 | HR 58 | Temp 97.7°F | Ht 69.0 in | Wt 254.0 lb

## 2015-08-27 DIAGNOSIS — A084 Viral intestinal infection, unspecified: Secondary | ICD-10-CM | POA: Insufficient documentation

## 2015-08-27 NOTE — Patient Instructions (Signed)
Push fluids, advance diet as tolerated.

## 2015-08-27 NOTE — Assessment & Plan Note (Signed)
Supportive care, resolving. Cleared to return to work.

## 2015-08-27 NOTE — Progress Notes (Signed)
   Subjective:    Patient ID: Lawrence DonathChristopher Yates, male    DOB: 17-Feb-1991, 25 y.o.   MRN: [id number]  HPI  25 year old male presents with 3 day history of emesis  Off and on.  He had emesis several time 08/24/2015.  Diarrhea off and on following this.  No fever.  No blood in stool or emesis.   Missed work on Wednesday given illness above. Needs note to for work excuse, was unable to get an appointment earlier in week when he was sick.  He was able to eat light, some fluids, still decreased appetite.  No UOP now.  All his symptoms have resolved at this point.   No sick contacts.     Review of Systems  Constitutional: Negative for fatigue.  HENT: Negative for ear pain.   Eyes: Negative for pain.  Respiratory: Negative for cough.   Cardiovascular: Negative for chest pain.       Objective:   Physical Exam  Constitutional: Vital signs are normal. He appears well-developed and well-nourished.  HENT:  Head: Normocephalic.  Right Ear: Hearing normal.  Left Ear: Hearing normal.  Nose: Nose normal.  Mouth/Throat: Oropharynx is clear and moist and mucous membranes are normal.  Neck: Trachea normal. Carotid bruit is not present. No thyroid mass and no thyromegaly present.  Cardiovascular: Normal rate, regular rhythm and normal pulses.  Exam reveals no gallop, no distant heart sounds and no friction rub.   No murmur heard. No peripheral edema  Pulmonary/Chest: Effort normal and breath sounds normal. No respiratory distress.  Skin: Skin is warm, dry and intact. No rash noted.  Psychiatric: He has a normal mood and affect. His speech is normal and behavior is normal. Thought content normal.          Assessment & Plan:

## 2015-08-27 NOTE — Progress Notes (Signed)
Pre visit review using our clinic review tool, if applicable. No additional management support is needed unless otherwise documented below in the visit note. 

## 2015-08-30 ENCOUNTER — Ambulatory Visit: Payer: Self-pay | Admitting: Primary Care

## 2016-05-01 ENCOUNTER — Encounter: Payer: Self-pay | Admitting: Primary Care

## 2016-05-01 ENCOUNTER — Ambulatory Visit (INDEPENDENT_AMBULATORY_CARE_PROVIDER_SITE_OTHER): Payer: BC Managed Care – PPO | Admitting: Primary Care

## 2016-05-01 VITALS — BP 134/84 | HR 62 | Temp 98.1°F | Ht 69.0 in | Wt 262.0 lb

## 2016-05-01 DIAGNOSIS — I809 Phlebitis and thrombophlebitis of unspecified site: Secondary | ICD-10-CM

## 2016-05-01 NOTE — Patient Instructions (Signed)
The vein to your left arm is irritated, likely from your donation last week.  Start Ibuprofen 400-600 mg three times daily as needed for inflammation/irritation.  Apply a warm compress to the site.  Please call me if you notice increased redness, increased swelling, fevers, pain.  It was a pleasure to see you today!    Phlebitis Phlebitis is soreness and swelling (inflammation) of a vein. This can occur in your arms, legs, or torso (trunk), as well as deeper inside your body. Phlebitis is usually not serious when it occurs close to the surface of the body. However, it can cause serious problems when it occurs in a vein deeper inside the body. What are the causes? Phlebitis can be triggered by various things, including:  Reduced blood flow through your veins. This can happen with:  Bed rest over a long period.  Long-distance travel.  Injury.  Surgery.  Being overweight (obese) or pregnant.  Having an IV tube put in the vein and getting certain medicines through the vein.  Cancer and cancer treatment.  Use of illegal drugs taken through the vein.  Inflammatory diseases.  Inherited (genetic) diseases that increase the risk of blood clots.  Hormone therapy, such as birth control pills. What are the signs or symptoms?  Red, tender, swollen, and painful area on your skin. Usually, the area will be long and narrow.  Firmness along the center of the affected area. This can indicate that a blood clot has formed.  Low-grade fever. How is this diagnosed? A health care provider can usually diagnose phlebitis by examining the affected area and asking about your symptoms. To check for infection or blood clots, your health care provider may order blood tests or an ultrasound exam of the area. Blood tests and your family history may also indicate if you have an underlying genetic disease that causes blood clots. Occasionally, a piece of tissue is taken from the body (biopsy sample) if  an unusual cause of phlebitis is suspected. How is this treated? Treatment will vary depending on the severity of the condition and the area of the body affected. Treatment may include:  Use of a warm compress or heating pad.  Use of compression stockings or bandages.  Anti-inflammatory medicines.  Removal of any IV tube that may be causing the problem.  Medicines that kill germs (antibiotics) if an infection is present.  Blood-thinning medicines if a blood clot is suspected or present.  In rare cases, surgery may be needed to remove damaged sections of vein. Follow these instructions at home:  Only take over-the-counter or prescription medicines as directed by your health care provider. Take all medicines exactly as prescribed.  Raise (elevate) the affected area above the level of your heart as directed by your health care provider.  Apply a warm compress or heating pad to the affected area as directed by your health care provider. Do not sleep with the heating pad.  Use compression stockings or bandages as directed. These will speed healing and prevent the condition from coming back.  If you are on blood thinners:  Get follow-up blood tests as directed by your health care provider.  Check with your health care provider before using any new medicines.  Carry a medical alert card or wear your medical alert jewelry to show that you are on blood thinners.  For phlebitis in the legs:  Avoid prolonged standing or bed rest.  Keep your legs moving. Raise your legs when sitting or lying.  Do  not smoke.  Women, particularly those over the age of 9, should consider the risks and benefits of taking the contraceptive pill. This kind of hormone treatment can increase your risk for blood clots.  Follow up with your health care provider as directed. Contact a health care provider if:  You have unusual bruising or any bleeding problems.  Your swelling or pain in the affected area  is not improving.  You are on anti-inflammatory medicine, and you develop belly (abdominal) pain. Get help right away if:  You have a sudden onset of chest pain or difficulty breathing.  You have a fever or persistent symptoms for more than 2-3 days.  You have a fever and your symptoms suddenly get worse. This information is not intended to replace advice given to you by your health care provider. Make sure you discuss any questions you have with your health care provider. Document Released: 02/21/2001 Document Revised: 08/05/2015 Document Reviewed: 11/04/2012 Elsevier Interactive Patient Education  2017 [employer].

## 2016-05-01 NOTE — Progress Notes (Signed)
Pre visit review using our clinic review tool, if applicable. No additional management support is needed unless otherwise documented below in the visit note. 

## 2016-05-01 NOTE — Progress Notes (Signed)
   Subjective:    Patient ID: Lawrence Yates, male    DOB: 1990-08-06, 26 y.o.   MRN: [id number]  HPI  [name] is a 26 year old male who presents today with a chief complaint of brusing. His bruising is located to the left antecubital fossa to the side of his donation. He donated plasma Monday last week and since then has noticed bruising with a raised vein to his right upper extremity. He noticed bleeding from the site when the phlebotomist pulled out the catheter once he completed his donation. He denies fevers, pain, increased swelling. He's not applied or done anything for his symptoms.  Review of Systems  Constitutional: Negative for fever.  Skin: Positive for color change. Negative for wound.  Neurological: Negative for weakness and numbness.       Past Medical History:  Diagnosis Date  . Asthma    execrise induced  . Chicken pox      Social History   Social History  . Marital status: Single    Spouse name: N/A  . Number of children: N/A  . Years of education: N/A   Occupational History  . Not on file.   Social History Main Topics  . Smoking status: Never Smoker  . Smokeless tobacco: Never Used  . Alcohol use 0.0 oz/week     Comment: socially  . Drug use: No  . Sexual activity: Not on file   Other Topics Concern  . Not on file   Social History Narrative   Married.   1 son.   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   Enjoys playing sports    Past Surgical History:  Procedure Laterality Date  . FOOT SURGERY    . FOOT SURGERY     left foot    Family History  Problem Relation Age of Onset  . Hypertension Mother   . Diabetes Father     No Known Allergies  Current Outpatient Prescriptions on File Prior to Visit  Medication Sig Dispense Refill  . albuterol (PROVENTIL HFA;VENTOLIN HFA) 108 (90 Base) MCG/ACT inhaler Inhale 2 puffs 15 minutes prior to exercise or every 6 hours as needed for shortness of breath/wheezing.Marland Kitchen. 1 Inhaler 5  .  amphetamine-dextroamphetamine (ADDERALL) 10 MG tablet Take 1 tablet (10 mg total) by mouth 2 (two) times daily with a meal. 60 tablet 0   No current facility-administered medications on file prior to visit.     BP 134/84   Pulse 62   Temp 98.1 F (36.7 C) (Oral)   Ht 5\' 9"  (1.753 m)   Wt 262 lb (118.8 kg)   SpO2 97%   BMI 38.69 kg/m    Objective:   Physical Exam  Constitutional: He appears well-nourished.  Neck: Neck supple.  Cardiovascular: Normal rate.   Pulmonary/Chest: Effort normal.  Skin: Skin is warm and dry.  Mild bruising with mild erythema long the vein line medial to the left antecubital fossa. Non tender.          Assessment & Plan:  Phlebitis:  Located to the medial left AC of left upper extremity. Suspect symptoms corollated to donation of plasma 1 week ago. Site does not appear acutely infected, just slightly irritated. Non tender. Discussed NSAIDs, warm compresses, refrain from donating plasma to that extremity.  Return precautions provided.  Morrie Sheldonlark,Yaxiel Minnie Kendal, NP

## 2016-05-08 ENCOUNTER — Encounter: Payer: Self-pay | Admitting: Podiatry

## 2016-05-08 ENCOUNTER — Ambulatory Visit (INDEPENDENT_AMBULATORY_CARE_PROVIDER_SITE_OTHER): Payer: BC Managed Care – PPO

## 2016-05-08 ENCOUNTER — Ambulatory Visit (INDEPENDENT_AMBULATORY_CARE_PROVIDER_SITE_OTHER): Payer: BC Managed Care – PPO | Admitting: Podiatry

## 2016-05-08 VITALS — BP 110/67 | HR 87

## 2016-05-08 DIAGNOSIS — M722 Plantar fascial fibromatosis: Secondary | ICD-10-CM

## 2016-05-08 DIAGNOSIS — M779 Enthesopathy, unspecified: Secondary | ICD-10-CM

## 2016-05-08 DIAGNOSIS — M25572 Pain in left ankle and joints of left foot: Secondary | ICD-10-CM | POA: Diagnosis not present

## 2016-05-08 MED ORDER — TRIAMCINOLONE ACETONIDE 10 MG/ML IJ SUSP
10.0000 mg | Freq: Once | INTRAMUSCULAR | Status: AC
  Administered 2016-05-08: 10 mg

## 2016-05-08 NOTE — Progress Notes (Signed)
Subjective:     Patient ID: Lawrence Yates, male   DOB: 1990-11-05, 26 y.o.   MRN: [id number]  HPI patient presents after having had surgery as a child with severe flatfoot deformity with discomfort in his mid arch level and also in his posterior tibial tendon insertion and states he has had pain   Review of Systems  All other systems reviewed and are negative.      Objective:   Physical Exam  Constitutional: He is oriented to person, place, and time.  Cardiovascular: Intact distal pulses.   Musculoskeletal: Normal range of motion.  Neurological: He is oriented to person, place, and time.  Skin: Skin is warm.  Nursing note and vitals reviewed.  neurovascular status intact muscle strength adequate range of motion within normal limits with patient found to have significant flatfoot deformity bilateral with pain in the posterior tib insertion and also is noted to have significant incisions left from having had flatfoot surgery when he was 26 years old. Good digital perfusion well oriented 3     Assessment:     Inflammatory tendinitis posterior tib left with patient also noted to have moderate mid arch instability because of foot structure    Plan:     H&P and all conditions reviewed careful sheath injection administered left fascial brace was placed and then the patient was placed into a neutral position and was casted at this position for orthotic therapy to support the medial arch. Reappoint when ready  X-rays indicate moderate flatfoot deformity left over right with patient noted to have fixation from previous surgery left

## 2016-06-03 ENCOUNTER — Emergency Department: Payer: BC Managed Care – PPO

## 2016-06-03 ENCOUNTER — Encounter: Payer: Self-pay | Admitting: Emergency Medicine

## 2016-06-03 ENCOUNTER — Emergency Department
Admission: EM | Admit: 2016-06-03 | Discharge: 2016-06-03 | Disposition: A | Discharge status: Home / Self Care | Payer: BC Managed Care – PPO | Attending: Emergency Medicine | Admitting: Emergency Medicine

## 2016-06-03 DIAGNOSIS — S60212A Contusion of left wrist, initial encounter: Secondary | ICD-10-CM | POA: Diagnosis not present

## 2016-06-03 DIAGNOSIS — J45909 Unspecified asthma, uncomplicated: Secondary | ICD-10-CM | POA: Insufficient documentation

## 2016-06-03 DIAGNOSIS — S6992XA Unspecified injury of left wrist, hand and finger(s), initial encounter: Secondary | ICD-10-CM | POA: Diagnosis present

## 2016-06-03 DIAGNOSIS — Z79899 Other long term (current) drug therapy: Secondary | ICD-10-CM | POA: Diagnosis not present

## 2016-06-03 DIAGNOSIS — Y939 Activity, unspecified: Secondary | ICD-10-CM | POA: Diagnosis not present

## 2016-06-03 DIAGNOSIS — Y999 Unspecified external cause status: Secondary | ICD-10-CM | POA: Insufficient documentation

## 2016-06-03 DIAGNOSIS — F909 Attention-deficit hyperactivity disorder, unspecified type: Secondary | ICD-10-CM | POA: Diagnosis not present

## 2016-06-03 DIAGNOSIS — Y9289 Other specified places as the place of occurrence of the external cause: Secondary | ICD-10-CM | POA: Insufficient documentation

## 2016-06-03 LAB — BASIC METABOLIC PANEL
Anion gap: 7 (ref 5–15)
BUN: 22 mg/dL — ABNORMAL HIGH (ref 6–20)
CALCIUM: 8.9 mg/dL (ref 8.9–10.3)
CO2: 25 mmol/L (ref 22–32)
CREATININE: 1.22 mg/dL (ref 0.61–1.24)
Chloride: 107 mmol/L (ref 101–111)
Glucose, Bld: 91 mg/dL (ref 65–99)
Potassium: 3.3 mmol/L — ABNORMAL LOW (ref 3.5–5.1)
SODIUM: 139 mmol/L (ref 135–145)

## 2016-06-03 MED ORDER — IOPAMIDOL (ISOVUE-300) INJECTION 61%
75.0000 mL | Freq: Once | INTRAVENOUS | Status: AC | PRN
  Administered 2016-06-03: 75 mL via INTRAVENOUS

## 2016-06-03 NOTE — ED Notes (Signed)
Patient transported to X-ray 

## 2016-06-03 NOTE — Discharge Instructions (Signed)
Please return for any worsening symptoms. Please follow-up with your regular doctor or see the [facility] or the [facility] or the [facility] or the [facility] or [facility]. Use Advil or Tylenol as needed for pain. Do not use Advil for more than a week at a time.
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
## 2016-06-08 ENCOUNTER — Encounter: Payer: Self-pay | Admitting: Internal Medicine

## 2016-06-08 ENCOUNTER — Ambulatory Visit (INDEPENDENT_AMBULATORY_CARE_PROVIDER_SITE_OTHER): Payer: BC Managed Care – PPO | Admitting: Internal Medicine

## 2016-06-08 VITALS — BP 122/78 | HR 64 | Temp 98.9°F | Wt 260.5 lb

## 2016-06-08 DIAGNOSIS — M542 Cervicalgia: Secondary | ICD-10-CM | POA: Diagnosis not present

## 2016-06-08 DIAGNOSIS — G44201 Tension-type headache, unspecified, intractable: Secondary | ICD-10-CM | POA: Diagnosis not present

## 2016-06-08 MED ORDER — NAPROXEN 500 MG PO TABS: 500.0000 mg | ORAL_TABLET | Freq: Two times a day (BID) | ORAL | 0 refills | Status: DC

## 2016-06-08 MED ORDER — CYCLOBENZAPRINE HCL 5 MG PO TABS: 5.0000 mg | ORAL_TABLET | Freq: Two times a day (BID) | ORAL | 0 refills | Status: DC | PRN

## 2016-06-08 NOTE — Patient Instructions (Signed)
Neck Exercises Neck exercises can be important for many reasons:  They can help you to improve and maintain flexibility in your neck. This can be especially important as you age.  They can help to make your neck stronger. This can make movement easier.  They can reduce or prevent neck pain.  They may help your upper back.  Ask your health care provider which neck exercises would be best for you. Exercises Neck Press Repeat this exercise 10 times. Do it first thing in the morning and right before bed or as told by your health care provider. 1. Lie on your back on a firm bed or on the floor with a pillow under your head. 2. Use your neck muscles to push your head down on the pillow and straighten your spine. 3. Hold the position as well as you can. Keep your head facing up and your chin tucked. 4. Slowly count to 5 while holding this position. 5. Relax for a few seconds. Then repeat.  Isometric Strengthening Do a full set of these exercises 2 times a day or as told by your health care provider. 1. Sit in a supportive chair and place your hand on your forehead. 2. Push forward with your head and neck while pushing back with your hand. Hold for 10 seconds. 3. Relax. Then repeat the exercise 3 times. 4. Next, do thesequence again, this time putting your hand against the back of your head. Use your head and neck to push backward against the hand pressure. 5. Finally, do the same exercise on either side of your head, pushing sideways against the pressure of your hand.  Prone Head Lifts Repeat this exercise 5 times. Do this 2 times a day or as told by your health care provider. 1. Lie face-down, resting on your elbows so that your chest and upper back are raised. 2. Start with your head facing downward, near your chest. Position your chin either on or near your chest. 3. Slowly lift your head upward. Lift until you are looking straight ahead. Then continue lifting your head as far back as  you can stretch. 4. Hold your head up for 5 seconds. Then slowly lower it to your starting position.  Supine Head Lifts Repeat this exercise 8-10 times. Do this 2 times a day or as told by your health care provider. 1. Lie on your back, bending your knees to point to the ceiling and keeping your feet flat on the floor. 2. Lift your head slowly off the floor, raising your chin toward your chest. 3. Hold for 5 seconds. 4. Relax and repeat.  Scapular Retraction Repeat this exercise 5 times. Do this 2 times a day or as told by your health care provider. 1. Stand with your arms at your sides. Look straight ahead. 2. Slowly pull both shoulders backward and downward until you feel a stretch between your shoulder blades in your upper back. 3. Hold for 10-30 seconds. 4. Relax and repeat.  Contact a health care provider if:  Your neck pain or discomfort gets much worse when you do an exercise.  Your neck pain or discomfort does not improve within 2 hours after you exercise. If you have any of these problems, stop exercising right away. Do not do the exercises again unless your health care provider says that you can. Get help right away if:  You develop sudden, severe neck pain. If this happens, stop exercising right away. Do not do the exercises again unless your   health care provider says that you can. Exercises Neck Stretch  Repeat this exercise 3-5 times. 1. Do this exercise while standing or while sitting in a chair. 2. Place your feet flat on the floor, shoulder-width apart. 3. Slowly turn your head to the right. Turn it all the way to the right so you can look over your right shoulder. Do not tilt or tip your head. 4. Hold this position for 10-30 seconds. 5. Slowly turn your head to the left, to look over your left shoulder. 6. Hold this position for 10-30 seconds.  Neck Retraction Repeat this exercise 8-10 times. Do this 3-4 times a day or as told by your health care  provider. 1. Do this exercise while standing or while sitting in a sturdy chair. 2. Look straight ahead. Do not bend your neck. 3. Use your fingers to push your chin backward. Do not bend your neck for this movement. Continue to face straight ahead. If you are doing the exercise properly, you will feel a slight sensation in your throat and a stretch at the back of your neck. 4. Hold the stretch for 1-2 seconds. Relax and repeat.  This information is not intended to replace advice given to you by your health care provider. Make sure you discuss any questions you have with your health care provider. Document Released: 02/08/2015 Document Revised: 08/05/2015 Document Reviewed: 09/07/2014 Elsevier Interactive Patient Education  2017 Elsevier Inc.  
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
## 2016-06-29 ENCOUNTER — Telehealth: Payer: Self-pay | Admitting: Podiatry

## 2016-06-29 NOTE — Telephone Encounter (Signed)
Left message for patient to schedule an appointment to pick up orthotics. °

## 2016-08-03 ENCOUNTER — Other Ambulatory Visit: Payer: Self-pay | Admitting: Primary Care

## 2016-08-03 DIAGNOSIS — F900 Attention-deficit hyperactivity disorder, predominantly inattentive type: Secondary | ICD-10-CM

## 2016-08-03 NOTE — Telephone Encounter (Signed)
Please notify patient that I will need to see him in the office for ADHD follow up for any additional refills. Please schedule at his convenience.

## 2016-08-04 NOTE — Telephone Encounter (Signed)
Sent patient a message through My Chart.

## 2016-08-08 NOTE — Telephone Encounter (Signed)
Noted. Patient viewed results and Kate's comments on MyChart.  Follow up has been scheduled on 08/14/2016

## 2016-08-14 ENCOUNTER — Encounter: Payer: Self-pay | Admitting: Radiology

## 2016-08-14 ENCOUNTER — Encounter: Payer: Self-pay | Admitting: Primary Care

## 2016-08-14 ENCOUNTER — Ambulatory Visit (INDEPENDENT_AMBULATORY_CARE_PROVIDER_SITE_OTHER): Payer: BC Managed Care – PPO | Admitting: Primary Care

## 2016-08-14 VITALS — BP 126/84 | HR 64 | Temp 98.1°F | Ht 69.0 in | Wt 258.1 lb

## 2016-08-14 DIAGNOSIS — F900 Attention-deficit hyperactivity disorder, predominantly inattentive type: Secondary | ICD-10-CM | POA: Diagnosis not present

## 2016-08-14 DIAGNOSIS — J4520 Mild intermittent asthma, uncomplicated: Secondary | ICD-10-CM

## 2016-08-14 MED ORDER — AMPHETAMINE-DEXTROAMPHETAMINE 10 MG PO TABS: 10.0000 mg | ORAL_TABLET | Freq: Two times a day (BID) | ORAL | 0 refills | Status: DC

## 2016-08-14 MED ORDER — ALBUTEROL SULFATE HFA 108 (90 BASE) MCG/ACT IN AERS: 2.0000 | INHALATION_SPRAY | RESPIRATORY_TRACT | 1 refills | Status: DC | PRN

## 2016-08-14 NOTE — Assessment & Plan Note (Addendum)
Patient prefers Ventolin inhaler, prescription sent to pharmacy. Uses inhaler infrequently.

## 2016-08-14 NOTE — Patient Instructions (Signed)
Stop by the lab for your urine drug screen.  I sent the Ventolin inhaler to your pharmacy. Please call me if you have any problems at the pharmacy.  It was a pleasure to see you today!

## 2016-08-14 NOTE — Progress Notes (Signed)
   Subjective:    Patient ID: Lawrence Yates, male    DOB: 04/22/1990, 26 y.o.   MRN: [id number]  HPI  Mr. Lawrence Yates is a 26 year old male who presents today for follow up of ADHD.  He is currently managed on Adderall 10 mg twice daily for which he takes intermittently when taking graduate school classes. He's been taking Addreall five days weekly on avearge when in graduate school. He took 1 year off from school last year and has not taken Addreall. He recently started back at graduate school 2 weeks ago. His last dose of Adderall was Friday last week as he had a few tablets left over from his prior bottle. He is needing a refill today.  He's currently taking 2 classes which end on June 19th, and will start up with another 2 classes in July which end in August. He will likely take a few classes this Fall.  He continues to experience increase in focus, better comprehension of what he's reading, and better time management when taking Adderall. He denies chest pain, shortness of breath, palpitations.   Review of Systems  Respiratory: Negative for shortness of breath.   Cardiovascular: Negative for chest pain and palpitations.  Psychiatric/Behavioral:       Improved concentration and comprehension when on Adderall       Past Medical History:  Diagnosis Date  . Asthma    execrise induced  . Chicken pox      Social History   Social History  . Marital status: Single    Spouse name: N/A  . Number of children: N/A  . Years of education: N/A   Occupational History  . Not on file.   Social History Main Topics  . Smoking status: Never Smoker  . Smokeless tobacco: Never Used  . Alcohol use 0.0 oz/week     Comment: socially  . Drug use: No  . Sexual activity: Not on file   Other Topics Concern  . Not on file   Social History Narrative   Married.   1 son.   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   Enjoys playing sports    Past Surgical History:  Procedure Laterality  Date  . FOOT SURGERY    . FOOT SURGERY     left foot    Family History  Problem Relation Age of Onset  . Hypertension Mother   . Diabetes Father     No Known Allergies  No current outpatient prescriptions on file prior to visit.   No current facility-administered medications on file prior to visit.     BP 126/84   Pulse 64   Temp 98.1 F (36.7 C) (Oral)   Ht 5\' 9"  (1.753 m)   Wt 258 lb 1.9 oz (117.1 kg)   SpO2 97%   BMI 38.12 kg/m    Objective:   Physical Exam  Constitutional: He appears well-nourished.  Neck: Neck supple.  Cardiovascular: Normal rate and normal heart sounds.   Pulmonary/Chest: Effort normal and breath sounds normal.  Skin: Skin is warm and dry.  Psychiatric: He has a normal mood and affect.          Assessment & Plan:

## 2016-08-14 NOTE — Assessment & Plan Note (Signed)
Recently resumed graduate classes. Refill provided today. UDS and contract updated today. [facility]. His last fill date matches his HPI today. We'll continue to monitor.

## 2016-08-16 ENCOUNTER — Encounter: Payer: Self-pay | Admitting: Primary Care

## 2016-09-27 ENCOUNTER — Other Ambulatory Visit: Payer: Self-pay | Admitting: Primary Care

## 2016-09-27 DIAGNOSIS — F900 Attention-deficit hyperactivity disorder, predominantly inattentive type: Secondary | ICD-10-CM

## 2016-09-27 MED ORDER — AMPHETAMINE-DEXTROAMPHETAMINE 10 MG PO TABS: 10.0000 mg | ORAL_TABLET | Freq: Two times a day (BID) | ORAL | 0 refills | Status: DC

## 2016-09-27 NOTE — Telephone Encounter (Signed)
Ok to refill? Electronically refill request for amphetamine-dextroamphetamine (ADDERALL) 10 MG tablet.  Last prescribed on and seen on 08/14/2016

## 2016-09-28 NOTE — Telephone Encounter (Signed)
Left message for patient regarding Rx. Left in front office.  Patient is up to date on controlled substance contract, will need renew on 11/14/2016

## 2016-12-05 ENCOUNTER — Ambulatory Visit (INDEPENDENT_AMBULATORY_CARE_PROVIDER_SITE_OTHER): Payer: BC Managed Care – PPO | Admitting: Family

## 2016-12-05 ENCOUNTER — Encounter: Payer: Self-pay | Admitting: Family

## 2016-12-05 DIAGNOSIS — N5089 Other specified disorders of the male genital organs: Secondary | ICD-10-CM | POA: Diagnosis not present

## 2016-12-05 MED ORDER — CEFTRIAXONE SODIUM 500 MG IJ SOLR
500.0000 mg | Freq: Once | INTRAMUSCULAR | Status: AC
  Administered 2016-12-05: 250 mg via INTRAMUSCULAR

## 2016-12-05 MED ORDER — ACETAMINOPHEN-CODEINE #3 300-30 MG PO TABS: 1.0000 | ORAL_TABLET | ORAL | 0 refills | Status: DC | PRN

## 2016-12-05 MED ORDER — DOXYCYCLINE HYCLATE 100 MG PO TABS: 100.0000 mg | ORAL_TABLET | Freq: Two times a day (BID) | ORAL | 0 refills | Status: DC

## 2016-12-05 NOTE — Patient Instructions (Addendum)
Thank you for choosing [facility].  SUMMARY AND INSTRUCTIONS:  They will call to schedule your ultrasound.  Recommend follow up if your symptoms worsen or do not improve.   Medication:  Your prescription(s) have been submitted to your pharmacy or been printed and provided for you. Please take as directed and contact our office if you believe you are having problem(s) with the medication(s) or have any questions.   Follow up:  If your symptoms worsen or fail to improve, please contact our office for further instruction, or in case of emergency go directly to the emergency room at the closest medical facility.     Epididymitis Epididymitis is swelling (inflammation) of the epididymis. The epididymis is a cord-like structure that is located along the top and back part of the testicle. It collects and stores sperm from the testicle. This condition can also cause pain and swelling of the testicle and scrotum. Symptoms usually start suddenly (acute epididymitis). Sometimes epididymitis starts gradually and lasts for a while (chronic epididymitis). This type may be harder to treat. What are the causes? In men 77 and younger, this condition is usually caused by a bacterial infection or sexually transmitted disease (STD), such as:  Gonorrhea.  Chlamydia.  In men 86 and older who do not have anal sex, this condition is usually caused by bacteria from a blockage or abnormalities in the urinary system. These can result from:  Having a tube placed into the bladder (urinary catheter).  Having an enlarged or inflamed prostate gland.  Having recent urinary tract surgery.  In men who have a condition that weakens the body's defense system (immune system), such as HIV, this condition can be caused by:  Other bacteria, including tuberculosis and syphilis.  Viruses.  Fungi.  Sometimes this condition occurs without infection. That may happen if urine flows backward into the epididymis  after heavy lifting or straining. What increases the risk? This condition is more likely to develop in men:  Who have unprotected sex with more than one partner.  Who have anal sex.  Who have recently had surgery.  Who have a urinary catheter.  Who have urinary problems.  Who have a suppressed immune system.  What are the signs or symptoms? This condition usually begins suddenly with chills, fever, and pain behind the scrotum and in the testicle. Other symptoms include:  Swelling of the scrotum, testicle, or both.  Pain whenejaculatingor urinating.  Pain in the back or belly.  Nausea.  Itching and discharge from the penis.  Frequent need to pass urine.  Redness and tenderness of the scrotum.  How is this diagnosed? Your health care provider can diagnose this condition based on your symptoms and medical history. Your health care provider will also do a physical exam to ask about your symptoms and check your scrotum and testicle for swelling, pain, and redness. You may also have other tests, including:  Examination of discharge from the penis.  Urine tests for infections, such as STDs.  Your health care provider may test you for other STDs, including HIV. How is this treated? Treatment for this condition depends on the cause. If your condition is caused by a bacterial infection, oral antibiotic medicine may be prescribed. If the bacterial infection has spread to your blood, you may need to receive IV antibiotics. Nonbacterial epididymitis is treated with home care that includes bed rest and elevation of the scrotum. Surgery may be needed to treat:  Bacterial epididymitis that causes pus to build up  in the scrotum (abscess).  Chronic epididymitis that has not responded to other treatments.  Follow these instructions at home: Medicines  Take over-the-counter and prescription medicines only as told by your health care provider.  If you were prescribed an antibiotic  medicine, take it as told by your health care provider. Do not stop taking the antibiotic even if your condition improves. Sexual Activity  If your epididymitis was caused by an STD, avoid sexual activity until your treatment is complete.  Inform your sexual partner or partners if you test positive for an STD. They may need to be treated.Do not engage in sexual activity with your partner or partners until their treatment is completed. General instructions  Return to your normal activities as told by your health care provider. Ask your health care provider what activities are safe for you.  Keep your scrotum elevated and supported while resting. Ask your health care provider if you should wear a scrotal support, such as a jockstrap. Wear it as told by your health care provider.  If directed, apply ice to the affected area: ? Put ice in a plastic bag. ? Place a towel between your skin and the bag. ? Leave the ice on for 20 minutes, 2-3 times per day.  Try taking a sitz bath to help with discomfort. This is a warm water bath that is taken while you are sitting down. The water should only come up to your hips and should cover your buttocks. Do this 3-4 times per day or as told by your health care provider.  Keep all follow-up visits as told by your health care provider. This is important. Contact a health care provider if:  You have a fever.  Your pain medicine is not helping.  Your pain is getting worse.  Your symptoms do not improve within three days. This information is not intended to replace advice given to you by your health care provider. Make sure you discuss any questions you have with your health care provider. Document Released: 02/25/2000 Document Revised: 08/05/2015 Document Reviewed: 07/15/2014 Elsevier Interactive Patient Education  2018 [employer].

## 2016-12-05 NOTE — Assessment & Plan Note (Signed)
Testicular swelling with no evidence of torsion and most likely associated with epididymitis. Obtain US to rule out underlying cyst or masses. In office injection of 250 mg of ceftriaxone provided and prescription for doxycycline. Tylenol #3 as needed for discomfort. Follow up or seek additional treatment if symptoms worsen or do not improve.

## 2016-12-05 NOTE — Progress Notes (Signed)
Subjective:    Patient ID: Lawrence Yates, male    DOB: 05-20-1990, 26 y.o.   MRN: [id number]  Chief Complaint  Patient presents with  . Groin Swelling    starting this morning has had right sided testicle swelling, no other urinary sxs     HPI:  Lawrence Yates is a 26 y.o. male who  has a past medical history of Asthma and Chicken pox. and presents today for an acute office visit.   This is a new problem. Associated symptom of swelling located in his right testicle started this morning. No trauma or injury. No fevers. Described as tender to the touch and pain on occasion. Some achiness. No previous history of testicular pathology. No urinary symptoms. Denies any modifying factors or attempted treatments. No rashes or discoloration. Has been sexually active with the last being a few days ago.   No Known Allergies    Outpatient Medications Prior to Visit  Medication Sig Dispense Refill  . albuterol (PROVENTIL HFA;VENTOLIN HFA) 108 (90 Base) MCG/ACT inhaler Inhale 2 puffs into the lungs every 4 (four) hours as needed for wheezing or shortness of breath. 1 Inhaler 1  . amphetamine-dextroamphetamine (ADDERALL) 10 MG tablet Take 1 tablet (10 mg total) by mouth 2 (two) times daily with a meal. 60 tablet 0   No facility-administered medications prior to visit.       Past Surgical History:  Procedure Laterality Date  . FOOT SURGERY    . FOOT SURGERY     left foot      Past Medical History:  Diagnosis Date  . Asthma    execrise induced  . Chicken pox       Review of Systems  Constitutional: Negative for chills and fever.  Respiratory: Negative for chest tightness and shortness of breath.   Genitourinary: Positive for scrotal swelling and testicular pain. Negative for discharge, dysuria, flank pain, frequency, genital sores, hematuria, penile pain, penile swelling and urgency.      Objective:    BP 112/74 (BP Location: Left Arm, Patient Position: Sitting, Cuff  Size: Large)   Pulse 74   Temp 99.1 F (37.3 C) (Oral)   Resp 16   Ht  (1.753 m)   Wt 248 lb (112.5 kg)   SpO2 97%   BMI 36.62 kg/m  Nursing note and vital signs reviewed.  Physical Exam  Constitutional: He is oriented to person, place, and time. He appears well-developed and well-nourished. No distress.  Cardiovascular: Normal rate, regular rhythm, normal heart sounds and intact distal pulses.   Pulmonary/Chest: Effort normal and breath sounds normal.  Genitourinary:  Genitourinary Comments: Mild edema of the right scrotum with tenderness posterior tenderness of the right testicle with no masses or lesions appreciated.  Neurological: He is alert and oriented to person, place, and time.  Skin: Skin is warm and dry.  Psychiatric: He has a normal mood and affect. His behavior is normal. Judgment and thought content normal.       Assessment & Plan:   Problem List Items Addressed This Visit      Other   Testicular swelling, right    Testicular swelling with no evidence of torsion and most likely associated with epididymitis. Obtain US to rule out underlying cyst or masses. In office injection of 250 mg of ceftriaxone provided and prescription for doxycycline. Tylenol #3 as needed for discomfort. Follow up or seek additional treatment if symptoms worsen or do not improve.  Relevant Medications   cefTRIAXone (ROCEPHIN) injection 500 mg (Completed)   Other Relevant Orders   US PELVIC DOPPLER (TORSION R/O OR MASS ARTERIAL FLOW)   US Scrotum       I am having Mr. Standing start on doxycycline and acetaminophen-codeine. I am also having him maintain his albuterol and amphetamine-dextroamphetamine. We administered cefTRIAXone.   Meds ordered this encounter  Medications  . doxycycline (VIBRA-TABS) 100 MG tablet    Sig: Take 1 tablet (100 mg total) by mouth 2 (two) times daily.    Dispense:  20 tablet    Refill:  0    Order Specific Question:   Supervising Provider     Answer:   Hillard Danker A [[id number]]  . acetaminophen-codeine (TYLENOL #3) 300-30 MG tablet    Sig: Take 1 tablet by mouth every 4 (four) hours as needed for moderate pain.    Dispense:  30 tablet    Refill:  0    Order Specific Question:   Supervising Provider    Answer:   Hillard Danker A [[id number]]  . cefTRIAXone (ROCEPHIN) injection 500 mg     Follow-up: Return if symptoms worsen or fail to improve.  Jeanine Luz, FNP

## 2016-12-15 ENCOUNTER — Encounter: Payer: Self-pay | Admitting: Family

## 2017-01-05 ENCOUNTER — Ambulatory Visit: Payer: BC Managed Care – PPO | Admitting: Family Medicine

## 2017-01-05 ENCOUNTER — Ambulatory Visit (INDEPENDENT_AMBULATORY_CARE_PROVIDER_SITE_OTHER): Payer: BC Managed Care – PPO | Admitting: Family Medicine

## 2017-01-05 ENCOUNTER — Ambulatory Visit: Payer: BC Managed Care – PPO | Admitting: Internal Medicine

## 2017-01-05 ENCOUNTER — Encounter: Payer: Self-pay | Admitting: Family Medicine

## 2017-01-05 VITALS — BP 132/90 | HR 74 | Temp 98.2°F | Wt 255.0 lb

## 2017-01-05 DIAGNOSIS — J020 Streptococcal pharyngitis: Secondary | ICD-10-CM

## 2017-01-05 DIAGNOSIS — J029 Acute pharyngitis, unspecified: Secondary | ICD-10-CM | POA: Diagnosis not present

## 2017-01-05 LAB — POCT RAPID STREP A (OFFICE): RAPID STREP A SCREEN: POSITIVE — AB

## 2017-01-05 MED ORDER — AMOXICILLIN 500 MG PO CAPS: 500.0000 mg | ORAL_CAPSULE | Freq: Three times a day (TID) | ORAL | 0 refills | Status: DC

## 2017-01-05 NOTE — Progress Notes (Deleted)
   No chief complaint on file.   HPI: Lawrence Yates 26 y.o.  sda PCP NA ROS: See pertinent positives and negatives per HPI.  Past Medical History:  Diagnosis Date  . Asthma    execrise induced  . Chicken pox     Family History  Problem Relation Age of Onset  . Hypertension Mother   . Diabetes Father     Social History   Social History  . Marital status: Single    Spouse name: N/A  . Number of children: N/A  . Years of education: N/A   Social History Main Topics  . Smoking status: Never Smoker  . Smokeless tobacco: Never Used  . Alcohol use 0.0 oz/week     Comment: socially  . Drug use: No  . Sexual activity: Not on file   Other Topics Concern  . Not on file   Social History Narrative   Married.   1 son.   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   Enjoys playing sports    Outpatient Medications Prior to Visit  Medication Sig Dispense Refill  . acetaminophen-codeine (TYLENOL #3) 300-30 MG tablet Take 1 tablet by mouth every 4 (four) hours as needed for moderate pain. 30 tablet 0  . albuterol (PROVENTIL HFA;VENTOLIN HFA) 108 (90 Base) MCG/ACT inhaler Inhale 2 puffs into the lungs every 4 (four) hours as needed for wheezing or shortness of breath. 1 Inhaler 1  . amphetamine-dextroamphetamine (ADDERALL) 10 MG tablet Take 1 tablet (10 mg total) by mouth 2 (two) times daily with a meal. 60 tablet 0  . doxycycline (VIBRA-TABS) 100 MG tablet Take 1 tablet (100 mg total) by mouth 2 (two) times daily. 20 tablet 0   No facility-administered medications prior to visit.      EXAM:  There were no vitals taken for this visit.  There is no height or weight on file to calculate BMI.  GENERAL: vitals reviewed and listed above, alert, oriented, appears well hydrated and in no acute distress HEENT: atraumatic, conjunctiva  clear, no obvious abnormalities on inspection of external nose and ears OP : no lesion edema or exudate  NECK: no obvious masses on  inspection palpation  LUNGS: clear to auscultation bilaterally, no wheezes, rales or rhonchi, good air movement CV: HRRR, no clubbing cyanosis or  peripheral edema nl cap refill  MS: moves all extremities without noticeable focal  abnormality PSYCH: pleasant and cooperative, no obvious depression or anxiety  ASSESSMENT AND PLAN:  Discussed the following assessment and plan:  No diagnosis found.  -Patient advised to return or notify health care team  if symptoms worsen ,persist or new concerns arise.  There are no Patient Instructions on file for this visit.   Neta MendsWanda K. Panosh M.D.

## 2017-01-05 NOTE — Progress Notes (Signed)
   Subjective:    Patient ID: Lawrence DonathChristopher Yates, male    DOB: Jan 21, 1991, 26 y.o.   MRN: [id number]  HPI This is a 26 yo male who was sick last week and got better now is feeling worse. He has sore throat, pain with swallowing, nasal congestion. No cough/wheeze/SOB. No fever. Very fatigued. He is a Midwifekindergarten teacher.   Past Medical History:  Diagnosis Date  . Asthma    execrise induced  . Chicken pox    Past Surgical History:  Procedure Laterality Date  . FOOT SURGERY    . FOOT SURGERY     left foot   Family History  Problem Relation Age of Onset  . Hypertension Mother   . Diabetes Father    Social History  Substance Use Topics  . Smoking status: Never Smoker  . Smokeless tobacco: Never Used  . Alcohol use 0.0 oz/week     Comment: socially      Review of Systems Per HPI    Objective:   Physical Exam  Constitutional: He is oriented to person, place, and time. He appears well-developed and well-nourished.  HENT:  Head: Normocephalic and atraumatic.  Right Ear: Tympanic membrane, external ear and ear canal normal.  Left Ear: Tympanic membrane, external ear and ear canal normal.  Nose: Nose normal.  Mouth/Throat: Uvula is midline and mucous membranes are normal. Oropharyngeal exudate and posterior oropharyngeal erythema present. No posterior oropharyngeal edema or tonsillar abscesses.  Eyes: Conjunctivae are normal.  Neck: Normal range of motion. Neck supple.  Cardiovascular: Normal rate, regular rhythm and normal heart sounds.   Pulmonary/Chest: Effort normal and breath sounds normal.  Lymphadenopathy:    He has no cervical adenopathy.  Neurological: He is alert and oriented to person, place, and time.  Skin: Skin is warm and dry.  Psychiatric: He has a normal mood and affect. His behavior is normal. Judgment and thought content normal.  Vitals reviewed.     BP 132/90 (BP Location: Left Arm, Patient Position: Sitting, Cuff Size: Normal)   Pulse 74   Temp  98.2 F (36.8 C) (Oral)   Wt 255 lb (115.7 kg)   SpO2 96%   BMI 37.66 kg/m      Results for orders placed or performed in visit on 01/05/17  POCT rapid strep A  Result Value Ref Range   Rapid Strep A Screen Positive (A) Negative    Assessment & Plan:  1. Sore throat - POCT rapid strep A  2. Strep pharyngitis -Provided written and verbal information regarding diagnosis and treatment. - RTC/ED precautions reviewed - amoxicillin (AMOXIL) 500 MG capsule; Take 1 capsule (500 mg total) by mouth 3 (three) times daily.  Dispense: 20 capsule; Refill: 0  Olean Reeeborah Gessner, FNP-BC  [facility]  01/09/2017 4:28 PM

## 2017-01-05 NOTE — Patient Instructions (Addendum)
For nasal congestion you can use Afrin nasal spray for 3 days max, Sudafed, saline nasal spray (generic is fine for all). For cough you can try Delsym. Drink enough fluids to make your urine light yellow. For fever/chill/muscle aches you can take over the counter acetaminophen or ibuprofen.  Please come back in if you are not better in 5-7 days or if you develop wheezing, shortness of breath or persistent vomiting.   Strep Throat Strep throat is a bacterial infection of the throat. Your health care provider may call the infection tonsillitis or pharyngitis, depending on whether there is swelling in the tonsils or at the back of the throat. Strep throat is most common during the cold months of the year in children who are 47-33 years of age, but it can happen during any season in people of any age. This infection is spread from person to person (contagious) through coughing, sneezing, or close contact. What are the causes? Strep throat is caused by the bacteria called Streptococcus pyogenes. What increases the risk? This condition is more likely to develop in:  People who spend time in crowded places where the infection can spread easily.  People who have close contact with someone who has strep throat.  What are the signs or symptoms? Symptoms of this condition include:  Fever or chills.  Redness, swelling, or pain in the tonsils or throat.  Pain or difficulty when swallowing.  White or yellow spots on the tonsils or throat.  Swollen, tender glands in the neck or under the jaw.  Red rash all over the body (rare).  How is this diagnosed? This condition is diagnosed by performing a rapid strep test or by taking a swab of your throat (throat culture test). Results from a rapid strep test are usually ready in a few minutes, but throat culture test results are available after one or two days. How is this treated? This condition is treated with antibiotic medicine. Follow these  instructions at home: Medicines  Take over-the-counter and prescription medicines only as told by your health care provider.  Take your antibiotic as told by your health care provider. Do not stop taking the antibiotic even if you start to feel better.  Have family members who also have a sore throat or fever tested for strep throat. They may need antibiotics if they have the strep infection. Eating and drinking  Do not share food, drinking cups, or personal items that could cause the infection to spread to other people.  If swallowing is difficult, try eating soft foods until your sore throat feels better.  Drink enough fluid to keep your urine clear or pale yellow. General instructions  Gargle with a salt-water mixture 3-4 times per day or as needed. To make a salt-water mixture, completely dissolve -1 tsp of salt in 1 cup of warm water.  Make sure that all household members wash their hands well.  Get plenty of rest.  Stay home from school or work until you have been taking antibiotics for 24 hours.  Keep all follow-up visits as told by your health care provider. This is important. Contact a health care provider if:  The glands in your neck continue to get bigger.  You develop a rash, cough, or earache.  You cough up a thick liquid that is green, yellow-brown, or bloody.  You have pain or discomfort that does not get better with medicine.  Your problems seem to be getting worse rather than better.  You have a  fever. Get help right away if:  You have new symptoms, such as vomiting, severe headache, stiff or painful neck, chest pain, or shortness of breath.  You have severe throat pain, drooling, or changes in your voice.  You have swelling of the neck, or the skin on the neck becomes red and tender.  You have signs of dehydration, such as fatigue, dry mouth, and decreased urination.  You become increasingly sleepy, or you cannot wake up completely.  Your joints  become red or painful. This information is not intended to replace advice given to you by your health care provider. Make sure you discuss any questions you have with your health care provider. Document Released: 02/25/2000 Document Revised: 10/27/2015 Document Reviewed: 06/22/2014 Elsevier Interactive Patient Education  2017 [employer]

## 2017-02-02 ENCOUNTER — Other Ambulatory Visit: Payer: Self-pay | Admitting: Family Medicine

## 2017-02-02 ENCOUNTER — Encounter: Payer: Self-pay | Admitting: Primary Care

## 2017-02-02 DIAGNOSIS — J020 Streptococcal pharyngitis: Secondary | ICD-10-CM

## 2017-03-02 ENCOUNTER — Ambulatory Visit: Payer: BC Managed Care – PPO | Admitting: Family Medicine

## 2017-03-02 ENCOUNTER — Other Ambulatory Visit: Payer: Self-pay | Admitting: *Deleted

## 2017-06-22 ENCOUNTER — Ambulatory Visit: Payer: BC Managed Care – PPO | Admitting: Primary Care

## 2017-06-22 DIAGNOSIS — Z0289 Encounter for other administrative examinations: Secondary | ICD-10-CM

## 2017-06-26 ENCOUNTER — Ambulatory Visit: Payer: Self-pay | Admitting: *Deleted

## 2017-06-26 NOTE — Telephone Encounter (Signed)
Pt stating that he fell and tripped and landed on left foot on 4/6. Pt states that since that time he has experienced pain in the toe beside the big toe and noticed that it is bent at the top and swollen at the base of the toe with some discoloration. Pt states it looks like something is poking out. Pt states the swelling has gone down since the original time of the injury and has been taping two toes together. Pt states he is still able to walk on his foot and wear a shoe, but does notice that he is not able to bend his toes. Pt previously scheduled for appt on 4/12, but states he was not able to make it at the scheduled time. Pt scheduled for appt on 4/18 and notified if symptoms worsened or new symptoms developed before appt to call the office back. Pt verbalized understanding.   Reason for Disposition . [1] Toe injury AND [2] bad limp or can't wear shoes/sandals  Answer Assessment - Initial Assessment Questions 1. MECHANISM: "How did the injury happen?"      Pt states he tripped and fell on foot 2. ONSET: "When did the injury happen?" (Minutes or hours ago)      Happened on 4/6 3. LOCATION: "What part of the toe is injured?" "Is the nail damaged?"      Top of toe beside big toe 4. APPEARANCE of TOE INJURY: "What does the injury look like?"      Bends at the top of and is dark in color 5. SEVERITY: "Can you use the foot normally?" "Can you walk?"      Yes pt can walk but is unable to bend toes 6. SIZE: For cuts, bruises, or swelling, ask: "How large is it?" (e.g., inches or centimeters;  entire toe)      Pt states it is a little swollen was worth 7. PAIN: "Is there pain?" If so, ask: "How bad is the pain?"   (e.g., Scale 1-10; or mild, moderate, severe)     Pain  8. TETANUS: For any breaks in the skin, ask: "When was the last tetanus booster?"     n/a 9. DIABETES: "Do you have a history of diabetes or poor circulation in the feet?"     no 10. OTHER SYMPTOMS: "Do you have any other  symptoms?"        Foot tingles and feels numb occasionally  Protocols used: TOE INJURY-A-AH

## 2017-06-28 ENCOUNTER — Ambulatory Visit (INDEPENDENT_AMBULATORY_CARE_PROVIDER_SITE_OTHER)
Admission: RE | Admit: 2017-06-28 | Discharge: 2017-06-28 | Disposition: A | Discharge status: Home / Self Care | Payer: BC Managed Care – PPO | Source: Ambulatory Visit | Attending: Primary Care | Admitting: Primary Care

## 2017-06-28 ENCOUNTER — Telehealth: Payer: Self-pay | Admitting: Family Medicine

## 2017-06-28 ENCOUNTER — Encounter: Payer: Self-pay | Admitting: Primary Care

## 2017-06-28 ENCOUNTER — Ambulatory Visit: Payer: BC Managed Care – PPO | Admitting: Primary Care

## 2017-06-28 VITALS — BP 132/78 | HR 68 | Temp 97.6°F | Ht 69.0 in | Wt 261.8 lb

## 2017-06-28 DIAGNOSIS — M79675 Pain in left toe(s): Secondary | ICD-10-CM | POA: Diagnosis not present

## 2017-06-28 NOTE — Telephone Encounter (Signed)
Information given to pt per notes of Dr. Milinda Antisower on 06/28/17. Pt verbalized understanding.

## 2017-06-28 NOTE — Progress Notes (Signed)
Subjective:    Patient ID: Lawrence Yates, male    DOB: [birth year], 27 y.o.   MRN: [id number]  HPI  Lawrence Yates is a 27 year old male who presents today with a chief complaint of toe pain.   His toe pain is located to the tip of the 2nd toe of the left foot which began 12 days ago. He tripped over a bag of newly boxed/packaged sheets. Initially his toe was swollen and it was difficult to walk. He tapped his second toe together with his third toe for three days, stopped taping over one week ago. He noticed numbness that is intermittent most everyday, also with decrease in ROM to the toe. He's worried that his toe may heal in disfigured.    He's been taking Tylenol with Codeine from an older prescription he had, no recent medication use.   Review of Systems  Musculoskeletal:       Left second toe pain  Skin: Negative for color change.  Neurological: Positive for numbness.       Past Medical History:  Diagnosis Date  . Asthma    execrise induced  . Chicken pox      Social History   Socioeconomic History  . Marital status: Single    Spouse name: Not on file  . Number of children: Not on file  . Years of education: Not on file  . Highest education level: Not on file  Occupational History  . Not on file  Social Needs  . Financial resource strain: Not on file  . Food insecurity:    Worry: Not on file    Inability: Not on file  . Transportation needs:    Medical: Not on file    Non-medical: Not on file  Tobacco Use  . Smoking status: Never Smoker  . Smokeless tobacco: Never Used  Substance and Sexual Activity  . Alcohol use: Yes    Alcohol/week: 0.0 oz    Comment: socially  . Drug use: No  . Sexual activity: Not on file  Lifestyle  . Physical activity:    Days per week: Not on file    Minutes per session: Not on file  . Stress: Not on file  Relationships  . Social connections:    Talks on phone: Not on file    Gets together: Not on file    Attends religious  service: Not on file    Active member of club or organization: Not on file    Attends meetings of clubs or organizations: Not on file    Relationship status: Not on file  . Intimate partner violence:    Fear of current or ex partner: Not on file    Emotionally abused: Not on file    Physically abused: Not on file    Forced sexual activity: Not on file  Other Topics Concern  . Not on file  Social History Narrative   Married.   1 son.   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   Enjoys playing sports    Past Surgical History:  Procedure Laterality Date  . FOOT SURGERY    . FOOT SURGERY     left foot    Family History  Problem Relation Age of Onset  . Hypertension Mother   . Diabetes Father     No Known Allergies  Current Outpatient Medications on File Prior to Visit  Medication Sig Dispense Refill  . albuterol (PROVENTIL HFA;VENTOLIN HFA) 108 (90 Base) MCG/ACT  inhaler Inhale 2 puffs into the lungs every 4 (four) hours as needed for wheezing or shortness of breath. (Patient not taking: Reported on 06/28/2017) 1 Inhaler 1   No current facility-administered medications on file prior to visit.     BP 132/78   Pulse 68   Temp 97.6 F (36.4 C) (Oral)   Ht 5\' 9"  (1.753 m)   Wt 261 lb 12 oz (118.7 kg)   SpO2 97%   BMI 38.65 kg/m    Objective:   Physical Exam  Constitutional: He appears well-nourished.  Cardiovascular: Normal rate.  Pulses:      Dorsalis pedis pulses are 2+ on the left side.       Posterior tibial pulses are 2+ on the left side.  Pulmonary/Chest: Effort normal.  Musculoskeletal:       Feet:  Decrease in flexion to DIP of left second toe. No swelling.  Skin: Skin is warm and dry. No erythema.  No bruising          Assessment & Plan:  Toe Pain:  Occurred just after tripping 12 days ago. Appears stable, but could very well be fractured. Plain films pending today. Recommended he resume buddy taping the toe. Also recommended post op  shoe for which he kindly declines. Consider ortho referral if needed. Discussed to start antiinflammatories.  Doreene NestKatherine K Kaelynn Igo, NP

## 2017-06-28 NOTE — Patient Instructions (Signed)
Start Ibuprofen 600 mg three times daily for pain and inflammation to the toe.  I do recommend the post op shoe as discussed today. This may be more affordable at the pharmacy.   Resume buddy taping your toes together as discussed.   Complete xray(s) prior to leaving today. I will notify you of your results once received.  It was a pleasure to see you today!

## 2017-06-28 NOTE — Telephone Encounter (Signed)
Please let pt know that there is a non displaced toe fracture of the 2nd toe Jae Dire(Kate is out of the office now)-she saw him for this   Please continue buddy taping the toes  Also consider post op shoe (she discussed this with him)  Anti inflammatory and ice as discussed as well   I will route this to Jae DireKate so she can make a further plan after the weekend

## 2017-06-28 NOTE — Telephone Encounter (Signed)
Left VM requesting pt to cal office back, will route to [facility] and Johny Drillinghan Astronomer(Kate's assistant) to f/u on next week

## 2017-06-29 NOTE — Telephone Encounter (Signed)
Noted and appreciate and agree with Dr. Royden Purlower's recommendations.
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
## 2017-11-26 ENCOUNTER — Encounter: Payer: Self-pay | Admitting: Family Medicine

## 2017-11-26 ENCOUNTER — Ambulatory Visit: Payer: BC Managed Care – PPO | Admitting: Family Medicine

## 2017-11-26 VITALS — BP 124/86 | HR 68 | Temp 98.7°F | Ht 69.0 in | Wt 263.8 lb

## 2017-11-26 DIAGNOSIS — S161XXA Strain of muscle, fascia and tendon at neck level, initial encounter: Secondary | ICD-10-CM | POA: Diagnosis not present

## 2017-11-26 MED ORDER — PREDNISONE 10 MG PO TABS: ORAL_TABLET | ORAL | 0 refills | Status: DC

## 2017-11-26 MED ORDER — CYCLOBENZAPRINE HCL 10 MG PO TABS: 10.0000 mg | ORAL_TABLET | Freq: Every evening | ORAL | 0 refills | Status: DC | PRN

## 2017-11-26 NOTE — Patient Instructions (Signed)
I have sent in prednisone and a muscle relaxer to your pharmacy Do not take any additional NSAIDs (Alleve, naprosyn, ibuprofen or Advil) while taking prednisone Take with a meal and full glass of water   Prednisone tablets What is this medicine? PREDNISONE (PRED ni sone) is a corticosteroid. It is commonly used to treat inflammation of the skin, joints, lungs, and other organs. Common conditions treated include asthma, allergies, and arthritis. It is also used for other conditions, such as blood disorders and diseases of the adrenal glands. This medicine may be used for other purposes; ask your health care provider or pharmacist if you have questions. COMMON BRAND NAME(S): Deltasone, Predone, Sterapred, Sterapred DS What should I tell my health care provider before I take this medicine? They need to know if you have any of these conditions: -Cushing's syndrome -diabetes -glaucoma -heart disease -high blood pressure -infection (especially a virus infection such as chickenpox, cold sores, or herpes) -kidney disease -liver disease -mental illness -myasthenia gravis -osteoporosis -seizures -stomach or intestine problems -thyroid disease -an unusual or allergic reaction to lactose, prednisone, other medicines, foods, dyes, or preservatives -pregnant or trying to get pregnant -breast-feeding How should I use this medicine? Take this medicine by mouth with a glass of water. Follow the directions on the prescription label. Take this medicine with food. If you are taking this medicine once a day, take it in the morning. Do not take more medicine than you are told to take. Do not suddenly stop taking your medicine because you may develop a severe reaction. Your doctor will tell you how much medicine to take. If your doctor wants you to stop the medicine, the dose may be slowly lowered over time to avoid any side effects. Talk to your pediatrician regarding the use of this medicine in children.  Special care may be needed. Overdosage: If you think you have taken too much of this medicine contact a poison control center or emergency room at once. NOTE: This medicine is only for you. Do not share this medicine with others. What if I miss a dose? If you miss a dose, take it as soon as you can. If it is almost time for your next dose, talk to your doctor or health care professional. You may need to miss a dose or take an extra dose. Do not take double or extra doses without advice. What may interact with this medicine? Do not take this medicine with any of the following medications: -metyrapone -mifepristone This medicine may also interact with the following medications: -aminoglutethimide -amphotericin B -aspirin and aspirin-like medicines -barbiturates -certain medicines for diabetes, like glipizide or glyburide -cholestyramine -cholinesterase inhibitors -cyclosporine -digoxin -diuretics -ephedrine -male hormones, like estrogens and birth control pills -isoniazid -ketoconazole -NSAIDS, medicines for pain and inflammation, like ibuprofen or naproxen -phenytoin -rifampin -toxoids -vaccines -warfarin This list may not describe all possible interactions. Give your health care provider a list of all the medicines, herbs, non-prescription drugs, or dietary supplements you use. Also tell them if you smoke, drink alcohol, or use illegal drugs. Some items may interact with your medicine. What should I watch for while using this medicine? Visit your doctor or health care professional for regular checks on your progress. If you are taking this medicine over a prolonged period, carry an identification card with your name and address, the type and dose of your medicine, and your doctor's name and address. This medicine may increase your risk of getting an infection. Tell your doctor or health care  professional if you are around anyone with measles or chickenpox, or if you develop sores  or blisters that do not heal properly. If you are going to have surgery, tell your doctor or health care professional that you have taken this medicine within the last twelve months. Ask your doctor or health care professional about your diet. You may need to lower the amount of salt you eat. This medicine may affect blood sugar levels. If you have diabetes, check with your doctor or health care professional before you change your diet or the dose of your diabetic medicine. What side effects may I notice from receiving this medicine? Side effects that you should report to your doctor or health care professional as soon as possible: -allergic reactions like skin rash, itching or hives, swelling of the face, lips, or tongue -changes in emotions or moods -changes in vision -depressed mood -eye pain -fever or chills, cough, sore throat, pain or difficulty passing urine -increased thirst -swelling of ankles, feet Side effects that usually do not require medical attention (report to your doctor or health care professional if they continue or are bothersome): -confusion, excitement, restlessness -headache -nausea, vomiting -skin problems, acne, thin and shiny skin -trouble sleeping -weight gain This list may not describe all possible side effects. Call your doctor for medical advice about side effects. You may report side effects to FDA at 1-800-FDA-1088. Where should I keep my medicine? Keep out of the reach of children. Store at room temperature between 15 and 30 degrees C (59 and 86 degrees F). Protect from light. Keep container tightly closed. Throw away any unused medicine after the expiration date. NOTE: This sheet is a summary. It may not cover all possible information. If you have questions about this medicine, talk to your doctor, pharmacist, or health care provider.  2018 Elsevier/Gold Standard (2010-10-13 10:57:14)   Neck Exercises Neck exercises can be important for many  reasons:  They can help you to improve and maintain flexibility in your neck. This can be especially important as you age.  They can help to make your neck stronger. This can make movement easier.  They can reduce or prevent neck pain.  They may help your upper back.  Ask your health care provider which neck exercises would be best for you. Exercises Neck Press Repeat this exercise 10 times. Do it first thing in the morning and right before bed or as told by your health care provider. 1. Lie on your back on a firm bed or on the floor with a pillow under your head. 2. Use your neck muscles to push your head down on the pillow and straighten your spine. 3. Hold the position as well as you can. Keep your head facing up and your chin tucked. 4. Slowly count to 5 while holding this position. 5. Relax for a few seconds. Then repeat.  Isometric Strengthening Do a full set of these exercises 2 times a day or as told by your health care provider. 1. Sit in a supportive chair and place your hand on your forehead. 2. Push forward with your head and neck while pushing back with your hand. Hold for 10 seconds. 3. Relax. Then repeat the exercise 3 times. 4. Next, do thesequence again, this time putting your hand against the back of your head. Use your head and neck to push backward against the hand pressure. 5. Finally, do the same exercise on either side of your head, pushing sideways against the pressure of your  hand.  Prone Head Lifts Repeat this exercise 5 times. Do this 2 times a day or as told by your health care provider. 1. Lie face-down, resting on your elbows so that your chest and upper back are raised. 2. Start with your head facing downward, near your chest. Position your chin either on or near your chest. 3. Slowly lift your head upward. Lift until you are looking straight ahead. Then continue lifting your head as far back as you can stretch. 4. Hold your head up for 5 seconds. Then  slowly lower it to your starting position.  Supine Head Lifts Repeat this exercise 8-10 times. Do this 2 times a day or as told by your health care provider. 1. Lie on your back, bending your knees to point to the ceiling and keeping your feet flat on the floor. 2. Lift your head slowly off the floor, raising your chin toward your chest. 3. Hold for 5 seconds. 4. Relax and repeat.  Scapular Retraction Repeat this exercise 5 times. Do this 2 times a day or as told by your health care provider. 1. Stand with your arms at your sides. Look straight ahead. 2. Slowly pull both shoulders backward and downward until you feel a stretch between your shoulder blades in your upper back. 3. Hold for 10-30 seconds. 4. Relax and repeat.  Contact a health care provider if:  Your neck pain or discomfort gets much worse when you do an exercise.  Your neck pain or discomfort does not improve within 2 hours after you exercise. If you have any of these problems, stop exercising right away. Do not do the exercises again unless your health care provider says that you can. Get help right away if:  You develop sudden, severe neck pain. If this happens, stop exercising right away. Do not do the exercises again unless your health care provider says that you can. Exercises Neck Stretch  Repeat this exercise 3-5 times. 1. Do this exercise while standing or while sitting in a chair. 2. Place your feet flat on the floor, shoulder-width apart. 3. Slowly turn your head to the right. Turn it all the way to the right so you can look over your right shoulder. Do not tilt or tip your head. 4. Hold this position for 10-30 seconds. 5. Slowly turn your head to the left, to look over your left shoulder. 6. Hold this position for 10-30 seconds.  Neck Retraction Repeat this exercise 8-10 times. Do this 3-4 times a day or as told by your health care provider. 1. Do this exercise while standing or while sitting in a sturdy  chair. 2. Look straight ahead. Do not bend your neck. 3. Use your fingers to push your chin backward. Do not bend your neck for this movement. Continue to face straight ahead. If you are doing the exercise properly, you will feel a slight sensation in your throat and a stretch at the back of your neck. 4. Hold the stretch for 1-2 seconds. Relax and repeat.  This information is not intended to replace advice given to you by your health care provider. Make sure you discuss any questions you have with your health care provider. Document Released: 02/08/2015 Document Revised: 08/05/2015 Document Reviewed: 09/07/2014 Elsevier Interactive Patient Education  [employer].

## 2017-11-26 NOTE — Progress Notes (Signed)
   Subjective:    Patient ID: Lawrence DonathChristopher Yates, male    DOB: 08-23-1990, 27 y.o.   MRN: [id number]  HPI This is a 27 yo male who presents today with neck pain. Has been off and on for over a month. Was recently playing foot ball 2 weeks ago when he had pain on left side of his neck with some pain down arm. Relief with ice and naproxen. Two days ago he began having pain on right side of neck. Currently with some numbness on top of right shoulder. No weakness. No bowel or bladder incontinence.  No recent falls. Exercises regularly.    Past Medical History:  Diagnosis Date  . Asthma    execrise induced  . Chicken pox    Past Surgical History:  Procedure Laterality Date  . FOOT SURGERY    . FOOT SURGERY     left foot   Family History  Problem Relation Age of Onset  . Hypertension Mother   . Diabetes Father    Social History   Tobacco Use  . Smoking status: Never Smoker  . Smokeless tobacco: Never Used  Substance Use Topics  . Alcohol use: Yes    Alcohol/week: 0.0 standard drinks    Comment: socially  . Drug use: No      Review of Systems Per HPI    Objective:   Physical Exam  Constitutional: He is oriented to person, place, and time. He appears well-developed and well-nourished.  HENT:  Head: Normocephalic and atraumatic.  Eyes: Pupils are equal, round, and reactive to light. Conjunctivae are normal.  Cardiovascular: Normal rate.  Pulmonary/Chest: Effort normal.  Musculoskeletal:       Cervical back: He exhibits tenderness (right trapezius) and pain (left rotation). He exhibits normal range of motion, no bony tenderness and no swelling.  Normal gait.   Neurological: He is alert and oriented to person, place, and time. He displays normal reflexes. No cranial nerve deficit. He exhibits normal muscle tone. Coordination normal.  Skin: Skin is warm and dry.  Psychiatric: He has a normal mood and affect. His behavior is normal. Judgment and thought content normal.    Vitals reviewed.     BP 124/86 (BP Location: Right Arm, Patient Position: Sitting, Cuff Size: Large)   Pulse 68   Temp 98.7 F (37.1 C) (Oral)   Ht 5\' 9"  (1.753 m)   Wt 263 lb 12 oz (119.6 kg)   SpO2 96%   BMI 38.95 kg/m  Wt Readings from Last 3 Encounters:  11/26/17 263 lb 12 oz (119.6 kg)  06/28/17 261 lb 12 oz (118.7 kg)  01/05/17 255 lb (115.7 kg)       Assessment & Plan:  1. Strain of neck muscle, initial encounter - Provided written and verbal information regarding diagnosis and treatment. Written neck exercises provided.  - RTC precautions reviewed - predniSONE (DELTASONE) 10 MG tablet; Take 6 x 1 day, 5 x 1 day, 4 x 1 day, 3 x 1 day, 2 x 1 day, 1 x 1 day  Dispense: 21 tablet; Refill: 0 - cyclobenzaprine (FLEXERIL) 10 MG tablet; Take 1 tablet (10 mg total) by mouth at bedtime as needed for muscle spasms.  Dispense: 14 tablet; Refill: 0   Olean Reeeborah Boaz Berisha, FNP-BC  [facility]  11/26/2017 5:01 PM

## 2017-11-27 ENCOUNTER — Ambulatory Visit: Payer: BC Managed Care – PPO | Admitting: Internal Medicine

## 2017-11-27 ENCOUNTER — Ambulatory Visit: Payer: BC Managed Care – PPO | Admitting: Family Medicine

## 2017-12-03 ENCOUNTER — Encounter: Payer: Self-pay | Admitting: Primary Care

## 2017-12-03 ENCOUNTER — Ambulatory Visit: Payer: BC Managed Care – PPO | Admitting: Primary Care

## 2017-12-03 VITALS — BP 130/86 | HR 84 | Temp 98.3°F | Ht 69.0 in | Wt 257.5 lb

## 2017-12-03 DIAGNOSIS — R3989 Other symptoms and signs involving the genitourinary system: Secondary | ICD-10-CM

## 2017-12-03 DIAGNOSIS — M542 Cervicalgia: Secondary | ICD-10-CM | POA: Diagnosis not present

## 2017-12-03 NOTE — Progress Notes (Signed)
Subjective:    Patient ID: Lawrence Yates, male    DOB: 1990/09/07, 27 y.o.   MRN: [id number]  HPI  Mr. Desantis is a 27 year old male who presents today with multiple complaints.  1) Groin Sore: He's had several sores pop up around his scrotum and penis that he first noticed two days ago. He's never been tested for herpes before. His sores are tender to touch, overall not painful. He's never noticed this before. He has recently been putting deodorant to the groin area due to excessive sweating. She does shave/trim his groin hair.   2) Neck Pain: Acute and intermittent over the last month, located to the left side with radiation of pain down the left upper extremity. He was evaluated on 11/26/17 in our office and diagnosed with neck strain. He was provided with a prescription for prednisone and cyclobenzaprine   Since his last visit he's noticed some stiffness, feels somewhat better overall, no significant improvement. Today he's pointing to the right side of his neck. His stiffness is worse during the morning, improved throughout the day. Overall he thinks he's getting better.  Review of Systems  Genitourinary:       Genital lesions  Musculoskeletal: Positive for neck pain.  Skin: Negative for color change.  Neurological: Negative for numbness.       Past Medical History:  Diagnosis Date  . Asthma    execrise induced  . Chicken pox      Social History   Socioeconomic History  . Marital status: Single    Spouse name: Not on file  . Number of children: Not on file  . Years of education: Not on file  . Highest education level: Not on file  Occupational History  . Not on file  Social Needs  . Financial resource strain: Not on file  . Food insecurity:    Worry: Not on file    Inability: Not on file  . Transportation needs:    Medical: Not on file    Non-medical: Not on file  Tobacco Use  . Smoking status: Never Smoker  . Smokeless tobacco: Never Used  Substance and  Sexual Activity  . Alcohol use: Yes    Alcohol/week: 0.0 standard drinks    Comment: socially  . Drug use: No  . Sexual activity: Not on file  Lifestyle  . Physical activity:    Days per week: Not on file    Minutes per session: Not on file  . Stress: Not on file  Relationships  . Social connections:    Talks on phone: Not on file    Gets together: Not on file    Attends religious service: Not on file    Active member of club or organization: Not on file    Attends meetings of clubs or organizations: Not on file    Relationship status: Not on file  . Intimate partner violence:    Fear of current or ex partner: Not on file    Emotionally abused: Not on file    Physically abused: Not on file    Forced sexual activity: Not on file  Other Topics Concern  . Not on file  Social History Narrative   Married.   1 son.   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   Enjoys playing sports    Past Surgical History:  Procedure Laterality Date  . FOOT SURGERY    . FOOT SURGERY     left foot  Family History  Problem Relation Age of Onset  . Hypertension Mother   . Diabetes Father     No Known Allergies  Current Outpatient Medications on File Prior to Visit  Medication Sig Dispense Refill  . albuterol (PROVENTIL HFA;VENTOLIN HFA) 108 (90 Base) MCG/ACT inhaler Inhale 2 puffs into the lungs every 4 (four) hours as needed for wheezing or shortness of breath. 1 Inhaler 1  . cyclobenzaprine (FLEXERIL) 10 MG tablet Take 1 tablet (10 mg total) by mouth at bedtime as needed for muscle spasms. 14 tablet 0   No current facility-administered medications on file prior to visit.     BP 130/86   Pulse 84   Temp 98.3 F (36.8 C) (Oral)   Ht 5\' 9"  (1.753 m)   Wt 257 lb 8 oz (116.8 kg)   SpO2 96%   BMI 38.03 kg/m    Objective:   Physical Exam  Constitutional: He appears well-nourished.  Neck: Normal range of motion. Neck supple.  Stiffness and discomfort with neck flexion    Cardiovascular: Normal rate and regular rhythm.  Respiratory: Effort normal and breath sounds normal.  Genitourinary:        Genitourinary Comments: Numerous small red spots to scrotum and penis in areas of hair follicles. Chaperone present.  Skin: Skin is warm and dry.           Assessment & Plan:  Acute Neck Pain:  Present for the last month, some improvement gradually overtime. Do suspect muscle strain. Discussed to use cyclobenzaprine HS. Discussed heat/ice, can also try NSAID's. Neck exercises discussed.  Avoid contact sports until better. Consider PT if symptoms persist.   Genital Sores:  To nearly entire scrotum and right side of penis.  Appears to be folliculitis  Discussed to avoid use of deodorant to the groin. Discussed gold bond for excessive sweating.  Screening for STD including gonorrhea/chlamydia, HIV, RPR, HSV.  Doreene NestKatherine K Kalisi Bevill, NP

## 2017-12-03 NOTE — Patient Instructions (Signed)
Stop by the lab prior to leaving today. I will notify you of your results once received.   Avoid applying Deoderant to the genital region. Try Gold Bond Powder to prevent excessive sweating/protecting to the groin.   It was a pleasure to see you today!

## 2017-12-05 ENCOUNTER — Telehealth: Payer: Self-pay | Admitting: Primary Care

## 2017-12-05 DIAGNOSIS — L739 Follicular disorder, unspecified: Secondary | ICD-10-CM

## 2017-12-05 NOTE — Telephone Encounter (Signed)
Copied from CRM [id number]. Topic: Quick Communication - See Telephone Encounter >> Dec 05, 2017  4:01 PM Maia Petties wrote: CRM for notification. See Telephone encounter for: 12/05/17. Pt called stating that the bumps are getting worse and is asking if there is any kind of medication for it. He said they are more red. Please call to advise.   [id number] - [place], [place] - [address]

## 2017-12-06 MED ORDER — MUPIROCIN CALCIUM 2 % EX CREA: 1.0000 "application " | TOPICAL_CREAM | Freq: Two times a day (BID) | CUTANEOUS | 0 refills | Status: DC

## 2017-12-06 NOTE — Telephone Encounter (Signed)
Message left for patient to return my call.  

## 2017-12-06 NOTE — Telephone Encounter (Signed)
Please notify patient that I sent a prescription for Bactroban antibiotic cream to his pharmacy. Use twice daily for one week. Have him update if no improvement.

## 2017-12-07 LAB — C. TRACHOMATIS/N. GONORRHOEAE RNA
C. TRACHOMATIS RNA, TMA: NOT DETECTED
N. GONORRHOEAE RNA, TMA: NOT DETECTED

## 2017-12-07 LAB — HSV 1/2 AB (IGM), IFA W/RFLX TITER
HSV 1 IgM Screen: NEGATIVE
HSV 2 IgM Screen: NEGATIVE

## 2017-12-07 LAB — RPR: RPR Ser Ql: NONREACTIVE

## 2017-12-07 LAB — HIV ANTIBODY (ROUTINE TESTING W REFLEX): HIV 1&2 Ab, 4th Generation: NONREACTIVE

## 2017-12-07 LAB — HSV(HERPES SIMPLEX VRS) I + II AB-IGG

## 2017-12-07 NOTE — Telephone Encounter (Signed)
Message left for patient to return my call.  

## 2018-03-27 ENCOUNTER — Other Ambulatory Visit (HOSPITAL_COMMUNITY)
Admission: RE | Admit: 2018-03-27 | Discharge: 2018-03-27 | Disposition: A | Discharge status: Home / Self Care | Payer: BC Managed Care – PPO | Source: Ambulatory Visit | Attending: Nurse Practitioner | Admitting: Nurse Practitioner

## 2018-03-27 ENCOUNTER — Encounter: Payer: Self-pay | Admitting: Nurse Practitioner

## 2018-03-27 ENCOUNTER — Ambulatory Visit: Payer: BC Managed Care – PPO | Admitting: Nurse Practitioner

## 2018-03-27 VITALS — BP 140/90 | HR 61 | Temp 98.8°F | Ht 69.0 in | Wt 271.8 lb

## 2018-03-27 DIAGNOSIS — L739 Follicular disorder, unspecified: Secondary | ICD-10-CM | POA: Diagnosis not present

## 2018-03-27 DIAGNOSIS — Z113 Encounter for screening for infections with a predominantly sexual mode of transmission: Secondary | ICD-10-CM | POA: Diagnosis not present

## 2018-03-27 DIAGNOSIS — R195 Other fecal abnormalities: Secondary | ICD-10-CM

## 2018-03-27 DIAGNOSIS — N485 Ulcer of penis: Secondary | ICD-10-CM | POA: Diagnosis not present

## 2018-03-27 DIAGNOSIS — K921 Melena: Secondary | ICD-10-CM | POA: Diagnosis not present

## 2018-03-27 LAB — IFOBT (OCCULT BLOOD): IFOBT: POSITIVE

## 2018-03-27 MED ORDER — CEPHALEXIN 500 MG PO CAPS: 500.0000 mg | ORAL_CAPSULE | Freq: Two times a day (BID) | ORAL | 0 refills | Status: DC

## 2018-03-27 MED ORDER — VALACYCLOVIR HCL 1 G PO TABS: 1000.0000 mg | ORAL_TABLET | Freq: Two times a day (BID) | ORAL | 0 refills | Status: DC

## 2018-03-27 NOTE — Patient Instructions (Signed)
Go to lab for urine collection and blood draw.  You will be contacted to schedule appt with GI.

## 2018-03-27 NOTE — Progress Notes (Signed)
Subjective:  Patient ID: Lawrence Yates, male    DOB: [birth year]  Age: 28 y.o. MRN: [id number]  CC: Exposure to STD (std check, ulcer on top, started 7 days ago, hx of folliculitis/ having blood in stool frequently (clots?))   Exposure to STD  The patient's primary symptoms include genital lesions. The patient's pertinent negatives include no genital injury, genital itching, pelvic pain, penile discharge, penile pain, priapism, scrotal swelling or testicular pain. This is a recurrent problem. The problem occurs intermittently. The problem has been unchanged. Associated symptoms include diarrhea. Pertinent negatives include no abdominal pain, chills, dysuria, fever, frequency, hesitancy, nausea, painful intercourse, rash or vomiting. Nothing aggravates the symptoms. He has tried nothing for the symptoms. He is sexually active. He inconsistently uses condoms. It is unknown whether or not his partner has an STD. There is no history of chlamydia, gonorrhea, herpes simplex, HIV, prostatitis or syphilis.  Rectal Bleeding   The current episode started more than 2 weeks ago (onset over 1year ago). The onset was gradual. The problem has been unchanged. The patient is experiencing no pain. The stool is described as soft. There was no prior successful therapy. There was no prior unsuccessful therapy. Associated symptoms include diarrhea. Pertinent negatives include no fever, no abdominal pain, no hematemesis, no hemorrhoids, no nausea, no rectal pain, no vomiting, no hematuria and no rash. Urine output has been normal. His past medical history does not include recent antibiotic use or a recent illness. There were no sick contacts. He has received no recent medical care.   blood in stool x 1year. Sees blood clots in commode, in stool and on tissue. associated with loose stool. Occurs every 1-73months.  Reviewed past Medical, Social and Family history today.  Outpatient Medications Prior to Visit  Medication  Sig Dispense Refill  . albuterol (PROVENTIL HFA;VENTOLIN HFA) 108 (90 Base) MCG/ACT inhaler Inhale 2 puffs into the lungs every 4 (four) hours as needed for wheezing or shortness of breath. 1 Inhaler 1  . cyclobenzaprine (FLEXERIL) 10 MG tablet Take 1 tablet (10 mg total) by mouth at bedtime as needed for muscle spasms. (Patient not taking: Reported on 03/27/2018) 14 tablet 0  . mupirocin cream (BACTROBAN) 2 % Apply 1 application topically 2 (two) times daily. (Patient not taking: Reported on 03/27/2018) 30 g 0   No facility-administered medications prior to visit.     ROS See HPI  Objective:  BP 140/90   Pulse 61   Temp 98.8 F (37.1 C) (Oral)   Ht 5\' 9"  (1.753 m)   Wt 271 lb 12.8 oz (123.3 kg)   SpO2 98%   BMI 40.14 kg/m   BP Readings from Last 3 Encounters:  03/27/18 140/90  12/03/17 130/86  11/26/17 124/86    Wt Readings from Last 3 Encounters:  03/27/18 271 lb 12.8 oz (123.3 kg)  12/03/17 257 lb 8 oz (116.8 kg)  11/26/17 263 lb 12 oz (119.6 kg)    Physical Exam Vitals signs reviewed. Exam conducted with a chaperone present.  Cardiovascular:     Rate and Rhythm: Normal rate.  Genitourinary:    Pubic Area: Rash present.     Penis: Circumcised. Lesions present. No tenderness or discharge.      Prostate: Normal.     Rectum: Guaiac result positive. No anal fissure or external hemorrhoid. Normal anal tone.       Comments: Erythematous papules on thigh and buttocks.  Unable to perform rectal exam with anoscope due to patient's discomfort. Skin:  Findings: Rash present.  Neurological:     Mental Status: He is alert.     Lab Results  Component Value Date   WBC 8.5 10/16/2014   HGB 14.4 10/16/2014   HCT 42.9 10/16/2014   PLT 296.0 10/16/2014   GLUCOSE 91 06/03/2016   CHOL 183 10/16/2014   TRIG 84.0 10/16/2014   HDL 37.90 (L) 10/16/2014   LDLCALC 128 (H) 10/16/2014   ALT 19 10/16/2014   AST 18 10/16/2014   NA 139 06/03/2016   K 3.3 (L) 06/03/2016   CL  107 06/03/2016   CREATININE 1.22 06/03/2016   BUN 22 (H) 06/03/2016   CO2 25 06/03/2016   HGBA1C 5.8 10/16/2014    Dg Foot Complete Left  Result Date: 06/28/2017 CLINICAL DATA:  Pain following recent injury EXAM: LEFT FOOT - COMPLETE 3+ VIEW COMPARISON:  May 08, 2016 FINDINGS: Frontal, oblique, and lateral views were obtained. There is an obliquely oriented fracture along the medial proximal aspect of the second proximal phalanx with alignment anatomic. No other fracture. No dislocation. There is postoperative change at several sites in the mid and hindfoot, stable. There is spurring in the talonavicular joint dorsally, stable. Other joint spaces appear normal. No erosive change. IMPRESSION: Nondisplaced obliquely oriented fracture medial, proximal aspect of second proximal phalanx. No other acute fracture. No dislocation. Areas of postoperative change in the mid and hindfoot regions. Spurring dorsal talonavicular joint. These results will be called to the ordering clinician or representative by the Radiologist Assistant, and communication documented in the PACS or zVision Dashboard. Electronically Signed   By: Bretta BangWilliam  Woodruff III M.D.   On: 06/28/2017 13:45    Assessment & Plan:   Cristal DeerChristopher was seen today for exposure to std.  Diagnoses and all orders for this visit:  Penile ulcer -     Herpes simplex virus culture -     RPR -     valACYclovir (VALTREX) 1000 MG tablet; Take 1 tablet (1,000 mg total) by mouth 2 (two) times daily.  Folliculitis of perineum -     cephALEXin (KEFLEX) 500 MG capsule; Take 1 capsule (500 mg total) by mouth 2 (two) times daily.  Screen for STD (sexually transmitted disease) -     HIV Antibody (routine testing w rflx) -     RPR -     Urine cytology ancillary only([place])  Occult blood positive stool -     Ambulatory referral to Gastroenterology  Blood in stool -     Ambulatory referral to Gastroenterology -     IFOBT POC (occult bld, rslt  in office)   I am having Lannette Donathhristopher Mckesson start on valACYclovir and cephALEXin. I am also having him maintain his albuterol, cyclobenzaprine, and mupirocin cream.  Meds ordered this encounter  Medications  . valACYclovir (VALTREX) 1000 MG tablet    Sig: Take 1 tablet (1,000 mg total) by mouth 2 (two) times daily.    Dispense:  20 tablet    Refill:  0    Order Specific Question:   Supervising Provider    Answer:   MATTHEWS, CODY [[id number]]  . cephALEXin (KEFLEX) 500 MG capsule    Sig: Take 1 capsule (500 mg total) by mouth 2 (two) times daily.    Dispense:  14 capsule    Refill:  0    Order Specific Question:   Supervising Provider    Answer:   MATTHEWS, CODY [[id number]]    Problem List Items Addressed This Visit    None  Visit Diagnoses    Penile ulcer    -  Primary   Relevant Medications   valACYclovir (VALTREX) 1000 MG tablet   Other Relevant Orders   Herpes simplex virus culture   RPR   Folliculitis of perineum       Relevant Medications   cephALEXin (KEFLEX) 500 MG capsule   Screen for STD (sexually transmitted disease)       Relevant Orders   HIV Antibody (routine testing w rflx)   RPR   Urine cytology ancillary only([place])   Occult blood positive stool       Relevant Orders   Ambulatory referral to Gastroenterology   Blood in stool       Relevant Orders   Ambulatory referral to Gastroenterology   IFOBT POC (occult bld, rslt in office) (Completed)       Follow-up: No follow-ups on file.  Alysia Penna, NP

## 2018-03-29 LAB — HERPES SIMPLEX VIRUS CULTURE
MICRO NUMBER:: 59238
SPECIMEN QUALITY:: ADEQUATE

## 2018-03-29 LAB — URINE CYTOLOGY ANCILLARY ONLY
Chlamydia: NEGATIVE
Neisseria Gonorrhea: NEGATIVE
Trichomonas: NEGATIVE

## 2018-03-29 LAB — HIV ANTIBODY (ROUTINE TESTING W REFLEX): HIV 1&2 Ab, 4th Generation: NONREACTIVE

## 2018-03-29 LAB — RPR: RPR Ser Ql: NONREACTIVE

## 2018-04-03 ENCOUNTER — Telehealth: Payer: Self-pay | Admitting: Primary Care

## 2018-04-03 NOTE — Telephone Encounter (Signed)
Copied from CRM [id number]. Topic: Quick Communication - Lab Results (Clinic Use ONLY) >> Mar 29, 2018  5:01 PM Noitamyae, Phetcharat, LPN wrote: Called patient to inform them of 03/29/18 lab results. When patient returns call, triage nurse may disclose results. >> Apr 03, 2018  2:47 PM Arlyss Gandy, NT wrote: Pt requesting a call back with lab results.

## 2018-04-03 NOTE — Telephone Encounter (Signed)
Results charted in result note.

## 2018-04-22 ENCOUNTER — Telehealth: Payer: Self-pay | Admitting: Primary Care

## 2018-04-22 ENCOUNTER — Encounter: Payer: Self-pay | Admitting: Family Medicine

## 2018-04-22 ENCOUNTER — Ambulatory Visit: Payer: BC Managed Care – PPO | Admitting: Family Medicine

## 2018-04-22 VITALS — BP 112/78 | HR 79 | Temp 99.2°F | Ht 69.0 in

## 2018-04-22 DIAGNOSIS — N50819 Testicular pain, unspecified: Secondary | ICD-10-CM

## 2018-04-22 NOTE — Patient Instructions (Addendum)
Go see Shirlee LimerickMarion on the way out.  Supportive underwear, tylenol vs ibuprofen as needed.  Take care.  Glad to see you.

## 2018-04-22 NOTE — Progress Notes (Signed)
Bilateral dull testicle pain, can alternate.  Comes and goes.  No sx now.  Going on for the last few weeks.    Prev hx noted, d/w pt.    No burning with urination.  No FCNAVD.  No discharge.  Single  partner currently, monogamous.    He had testicle pain when his prev testing was negative.    No recent lumps or masses.   Meds, vitals, and allergies reviewed.   ROS: Per HPI unless specifically indicated in ROS section   GEN: nad, alert and oriented HEENT: mucous membranes moist NECK: supple w/o LA CV: rrr.  PULM: ctab, no inc wob ABD: soft, +bs SKIN: no acute rash Testes bilaterally descended without nodularity, tenderness or masses. No scrotal masses or lesions. No penis ulceration or urethral discharge.

## 2018-04-22 NOTE — Telephone Encounter (Signed)
I see that patient had a visit with Dr. Para March today at 3 pm. Will review notes once received.

## 2018-04-22 NOTE — Telephone Encounter (Signed)
[facility] Night - Client TELEPHONE ADVICE RECORD [facility] Patient Name: Lawrence Yates Gender: Male DOB: 02-26-1991  Age: 28 Y 27 D Return Phone Number: [phone] (Primary) Address:  City/State/Zip: [place]  [address] Client [facility] Night - Client Client Site [facility] - Night Physician [name] - NP Contact Type Call Who Is Calling Patient / Member / Family / Caregiver Call Type Triage / Clinical Relationship To Patient Self Return Phone Number [phone] (Primary) Chief Complaint testicular symptom (non urgent symptom) Reason for Call Request to Schedule Office Appointment Initial Comment Caller states they would like to schedule an appointment for tomorrow. Caller states he has been having ongoing testicular pain. States he has had it before and it went away. States he was supposed to get an ultrasound but never did. Translation No Nurse Assessment Nurse: [name], RN, [name] Date/Time [name] Time): 04/21/2018 7:11:20 PM Confirm and document reason for call. If symptomatic, describe symptoms. ---caller sttes he has had some pain in his testicles started a few weeks ago, had it year ago, was supposed to get an ultrasound, no fever, no swelling. having a tingling in penis, near tp Has the patient traveled to [place] OR had close contact with a person known to have the novel coronavirus illness in the last 14 days? ---Not Applicable Does the patient have any new or worsening symptoms? ---Yes Will a triage be completed? ---Yes Related visit to physician within the last 2 weeks? ---No Does the PT have any chronic conditions? (i.e. diabetes, asthma, this includes High risk factors for pregnancy, etc.) ---No Is this a behavioral health or substance abuse call? ---No Guidelines Guideline Title Affirmed Question Affirmed Notes Nurse Date/Time [name] Time) Scrotal Pain [1] Pain  comes and goes (intermittent) AND [2] present > 24 hours  [name] 04/21/2018 7:14:14 PM Disp. Time [name] Time) Disposition Final User 04/21/2018 7:16:25 PM See PCP within 24 Hours Yes [name], RN, [name]   PLEASE NOTE:  All timestamps contained within this report are represented as [place] Standard Time. CONFIDENTIALTY NOTICE: This fax transmission is intended only for the addressee.  It contains information that is legally privileged, confidential or otherwise protected from use or disclosure.  If you are not the intended recipient, you are strictly prohibited from reviewing, disclosing, copying using or disseminating any of this information or taking any action in reliance on or regarding this information.  If you have received this fax in error, please notify us immediately by telephone so that we can arrange for its return to [place]. Phone:  [phone], Toll-Free:  [phone], Fax:  [phone] Page: 2 of 2 Call Id: [id number]   Caller Disagree/Comply Comply Caller Understands Yes PreDisposition Did not know what to do Care Advice Given Per Guideline SEE PCP WITHIN 24 HOURS: * IF OFFICE WILL BE OPEN: You need to be seen within the next 24 hours. Call your doctor (or NP/PA) when the office opens and make an appointment. PAIN MEDICINES: ACETAMINOPHEN (E.G., TYLENOL): IBUPROFEN (E.G., MOTRIN, ADVIL): * You become worse. CALL BACK IF: * Severe pain * Constant pain lasts over 1 hour * Fever over 100.4 F (38.0 C) CARE ADVICE given per Scrotal Pain (Adult) guideline. Referrals REFERRED TO PCP OFFICE

## 2018-04-24 DIAGNOSIS — N50819 Testicular pain, unspecified: Secondary | ICD-10-CM | POA: Insufficient documentation

## 2018-04-24 NOTE — Assessment & Plan Note (Signed)
Recent testing noted.  Gonorrhea chlamydia were negative.  HSV was previously treated.  It does not appear that he has pain typical for HSV at this point.  No active lesions. Check testicle ultrasound Supportive underwear, tylenol vs ibuprofen as needed.  He agrees with plan.  As a separate issue he can contact PCP if he is having recurrent issues with HSV that would prompt suppressive treatment.

## 2018-04-26 ENCOUNTER — Other Ambulatory Visit: Payer: BC Managed Care – PPO

## 2018-05-01 ENCOUNTER — Other Ambulatory Visit: Payer: BC Managed Care – PPO

## 2018-05-14 ENCOUNTER — Ambulatory Visit: Payer: BC Managed Care – PPO | Admitting: Primary Care

## 2018-05-14 ENCOUNTER — Encounter: Payer: Self-pay | Admitting: Primary Care

## 2018-05-14 ENCOUNTER — Telehealth: Payer: Self-pay

## 2018-05-14 VITALS — BP 136/84 | HR 72 | Temp 98.8°F | Ht 69.0 in | Wt 267.5 lb

## 2018-05-14 DIAGNOSIS — N50819 Testicular pain, unspecified: Secondary | ICD-10-CM

## 2018-05-14 LAB — POC URINALSYSI DIPSTICK (AUTOMATED)
Bilirubin, UA: NEGATIVE
Blood, UA: NEGATIVE
Glucose, UA: NEGATIVE
Ketones, UA: NEGATIVE
Leukocytes, UA: NEGATIVE
NITRITE UA: NEGATIVE
Protein, UA: NEGATIVE
Spec Grav, UA: >=1.03 — AB (ref 1.010–1.025)
UROBILINOGEN UA: 0.2 U/dL
pH, UA: 5.5 (ref 5.0–8.0)

## 2018-05-14 NOTE — Telephone Encounter (Signed)
Pt said that he is having same symptoms as when seen on 04/22/18 but symptoms are more frequent. Constant burning sensation in urethra; pain in scrotum more constant now; pain level 4. Pt said was not able to make [place]. No fever. 05/01/18 US scrotum w/doppler was a no show. Pt is going to be out of town until next wk starting on 05/15/18. Allayne Gitelman NP will see pt today at 4 PM. If pt condition worsens prior to appt pt will go to ED. Pt voiced understanding. FYI to Allayne Gitelman NP.

## 2018-05-14 NOTE — Patient Instructions (Signed)
You will be contacted regarding your ultrasound and the referral to Urology.  Please let us know if you have not been contacted within one week.   Please call me if you notice swelling and/or redness to the testicles, fevers.    It was a pleasure to see you today!

## 2018-05-14 NOTE — Assessment & Plan Note (Signed)
Acute for a duration of 2 months plus. Exam today benign, no evidence of epididymitis, testicular torsion, STD, renal stone. Urinalysis today negative. Ultrasound order placed for evaluation of scrotum. Referral placed to urology given duration of symptoms without obvious cause.
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
## 2018-05-14 NOTE — Telephone Encounter (Signed)
Noted.  Spoke with Shirlee Limerick today who does not believe he will be able to get in for an ultrasound today, possibly tomorrow.  We will meet with him as planned at 4 PM.

## 2018-05-15 ENCOUNTER — Ambulatory Visit: Payer: BC Managed Care – PPO | Admitting: Primary Care

## 2018-05-21 ENCOUNTER — Ambulatory Visit
Admission: RE | Admit: 2018-05-21 | Discharge: 2018-05-21 | Disposition: A | Discharge status: Home / Self Care | Payer: BC Managed Care – PPO | Source: Ambulatory Visit | Attending: Family Medicine | Admitting: Family Medicine

## 2018-05-21 DIAGNOSIS — N50819 Testicular pain, unspecified: Secondary | ICD-10-CM

## 2018-06-06 ENCOUNTER — Telehealth: Payer: Self-pay

## 2018-06-06 DIAGNOSIS — N50819 Testicular pain, unspecified: Secondary | ICD-10-CM

## 2018-06-06 NOTE — Telephone Encounter (Signed)
Pt said was seen on 05/14/18 and had referral to [facility]; pt received call this afternoon and pts appt was changed at [facility] urological  From 06/19/18 to end of May. Pt wants to know can he be seen sooner than that; pt still has burning and pain on and off with pain level 8. Pt has tried Aleve with no relief of pain. No fever,swelling or redness. Pt said if cold be seen sooner would go to [place]. [facility] [place]. Pt request cb.

## 2018-06-07 NOTE — Telephone Encounter (Signed)
Lawrence Yates, can you help get him in anywhere sooner?

## 2018-06-11 NOTE — Telephone Encounter (Signed)
Thank you, referral placed

## 2018-06-11 NOTE — Telephone Encounter (Signed)
Lawrence Yates I was able to get him in with [facility] for this Thursday but I need a New Urology Referral put into Epic so I can send the records with the New Referral. Please place the Referral.

## 2018-06-19 ENCOUNTER — Ambulatory Visit: Payer: Self-pay | Admitting: Urology

## 2018-08-06 IMAGING — DX DG FOOT COMPLETE 3+V*L*
3 series · 3 of 3 positions shown · non-contrast
Comparison: May 08, 2016

CLINICAL DATA: Pain following recent injury

EXAM:
LEFT FOOT - COMPLETE 3+ VIEW

[foot ap]
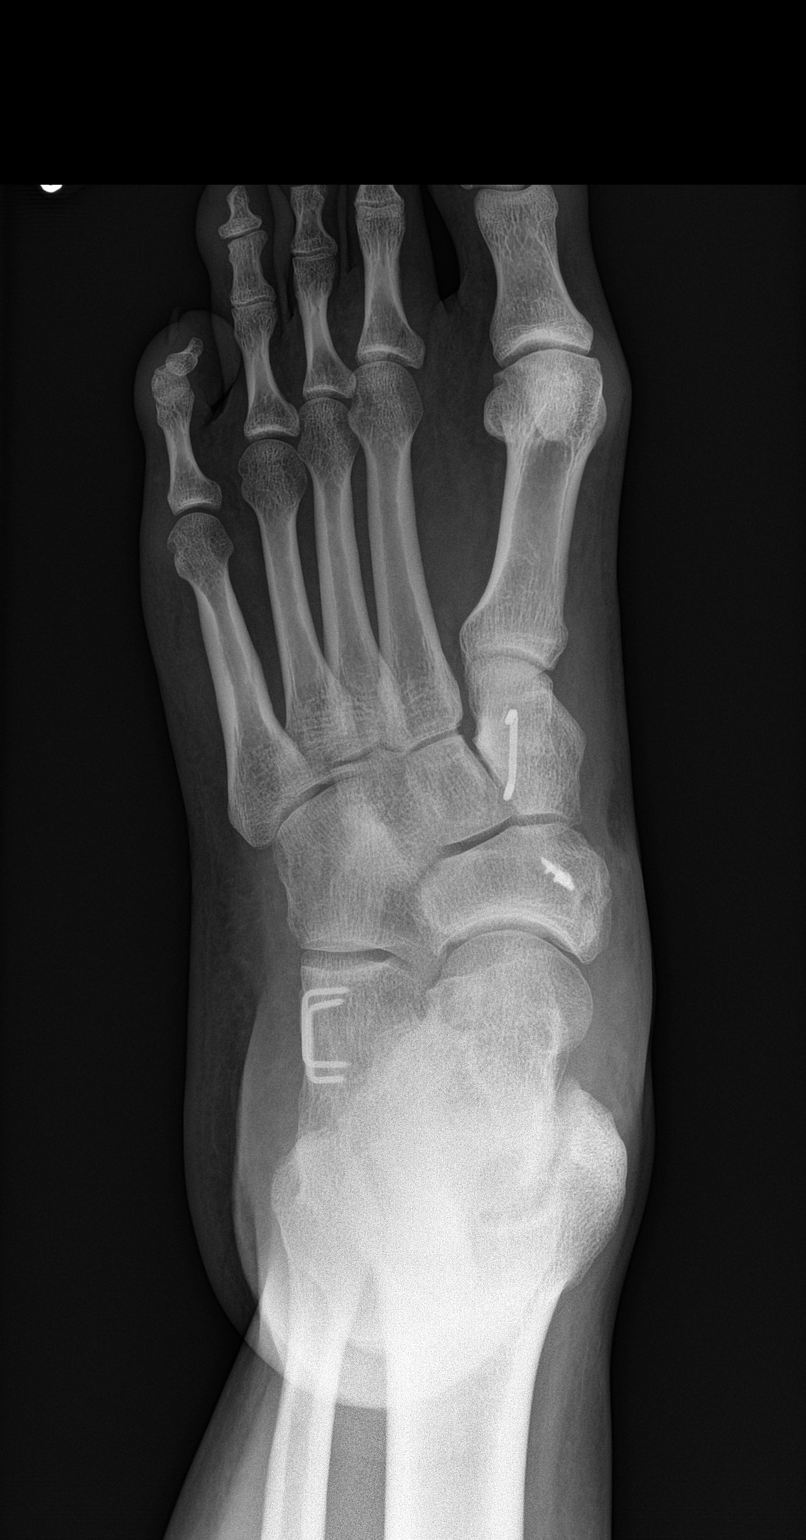

[foot obl]
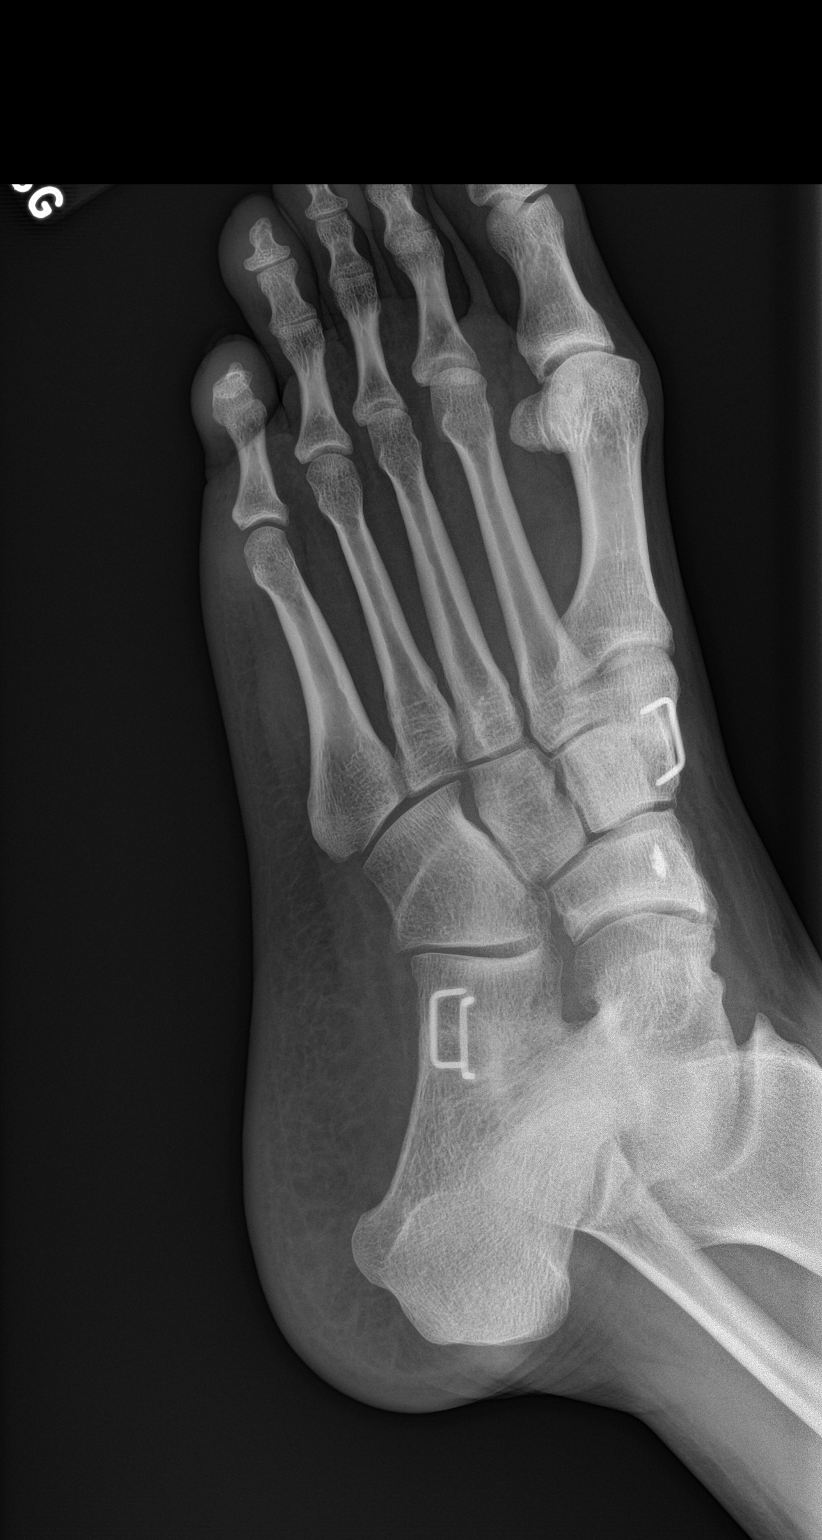

[foot lat]
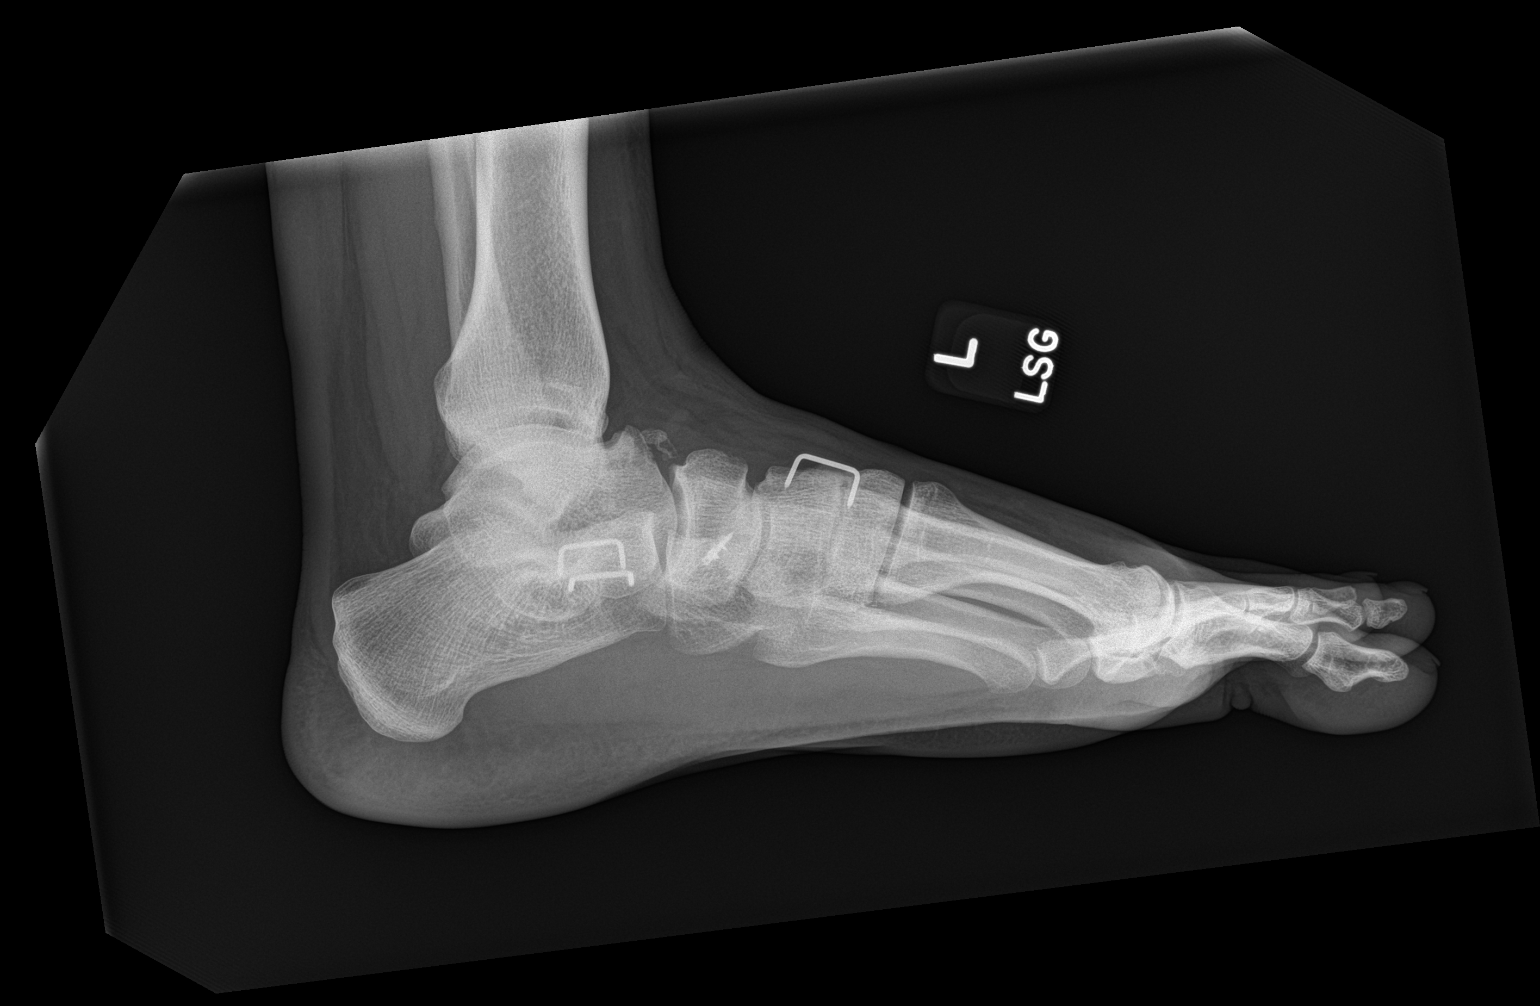

[3 of 3 positions shown; findings below may reference images not displayed]

FINDINGS: Frontal, oblique, and lateral views were obtained. There is an
obliquely oriented fracture along the medial proximal aspect of the
second proximal phalanx with alignment anatomic. No other fracture.
No dislocation. There is postoperative change at several sites in
the mid and hindfoot, stable. There is spurring in the talonavicular
joint dorsally, stable. Other joint spaces appear normal. No erosive
change.
IMPRESSION: Nondisplaced obliquely oriented fracture medial, proximal aspect of
second proximal phalanx. No other acute fracture. No dislocation.
Areas of postoperative change in the mid and hindfoot regions.
Spurring dorsal talonavicular joint.

These results will be called to the ordering clinician or
representative by the Radiologist Assistant, and communication
documented in the PACS or zVision Dashboard.

## 2018-08-22 ENCOUNTER — Other Ambulatory Visit: Payer: Self-pay

## 2018-08-22 DIAGNOSIS — J4520 Mild intermittent asthma, uncomplicated: Secondary | ICD-10-CM

## 2018-08-22 NOTE — Telephone Encounter (Signed)
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 No SOB& no wheezing. Pt has no covid symptoms other than mentioned above and no travel and no known covid exposure. Pt said cannot schedule virtual visit because does not have ins now. Pt request albuterol inhaler [facility] [place].Please advise.

## 2018-08-23 MED ORDER — ALBUTEROL SULFATE HFA 108 (90 BASE) MCG/ACT IN AERS: 2.0000 | INHALATION_SPRAY | RESPIRATORY_TRACT | 0 refills | Status: AC | PRN

## 2018-08-23 NOTE — Telephone Encounter (Signed)
Spoken and notified patient of Kate Clark's comments. Patient verbalized understanding.  

## 2018-08-23 NOTE — Telephone Encounter (Signed)
Please notify patient that I sent an inhaler for asthma management. This inhaler is only to be used as needed for SOB/wheezing. If he finds himself using this for more than 3 times weekly on average after 1-2 weeks then he needs to notify me.  It's not the best treatment to use this inhaler multiple times daily for more than 1-2 weeks for asthma.

## 2018-10-15 ENCOUNTER — Ambulatory Visit (HOSPITAL_COMMUNITY)
Admission: EM | Admit: 2018-10-15 | Discharge: 2018-10-15 | Discharge status: Against Medical Advice | Payer: BC Managed Care – PPO

## 2018-10-15 ENCOUNTER — Encounter (HOSPITAL_COMMUNITY): Payer: Self-pay | Admitting: Emergency Medicine

## 2018-10-15 ENCOUNTER — Ambulatory Visit (HOSPITAL_COMMUNITY)
Admission: EM | Admit: 2018-10-15 | Discharge: 2018-10-15 | Disposition: A | Discharge status: Home / Self Care | Payer: Self-pay | Attending: Internal Medicine | Admitting: Internal Medicine

## 2018-10-15 DIAGNOSIS — S0992XA Unspecified injury of nose, initial encounter: Secondary | ICD-10-CM

## 2018-10-15 MED ORDER — LIDOCAINE-EPINEPHRINE-TETRACAINE (LET) SOLUTION
3.0000 mL | Freq: Once | NASAL | Status: AC
  Administered 2018-10-15: 3 mL via TOPICAL

## 2018-10-15 MED ORDER — IBUPROFEN 600 MG PO TABS
600.0000 mg | ORAL_TABLET | Freq: Once | ORAL | Status: AC
  Administered 2018-10-15: 18:00:00 800 mg via ORAL

## 2018-10-15 MED ORDER — LIDOCAINE-EPINEPHRINE-TETRACAINE (LET) SOLUTION
NASAL | Status: AC
  Filled 2018-10-15: qty 3

## 2018-10-15 MED ORDER — HYDROCODONE-ACETAMINOPHEN 5-325 MG PO TABS: 2.0000 | ORAL_TABLET | ORAL | 0 refills | Status: AC | PRN

## 2018-10-15 MED ORDER — IBUPROFEN 800 MG PO TABS
ORAL_TABLET | ORAL | Status: AC
  Filled 2018-10-15: qty 1
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
## 2018-10-15 NOTE — ED Triage Notes (Signed)
Pt here for workers comp, sent to the appropriate location.
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
## 2019-12-04 IMAGING — US ULTRASOUND SCROTUM DOPPLER COMPLETE
1 series · 14 of 25 positions shown · non-contrast
Comparison: None.

CLINICAL DATA: Bilateral testicular pain.

EXAM:
SCROTAL ULTRASOUND
DOPPLER ULTRASOUND OF THE TESTICLES
TECHNIQUE: Complete ultrasound examination of the testicles, epididymis, and
other scrotal structures was performed. Color and spectral Doppler
ultrasound were also utilized to evaluate blood flow to the
testicles.

[Series 1: ultrasound scrotum doppler complete · 0.06mm/px · 14 of 41 slices shown]
[im 1/41]
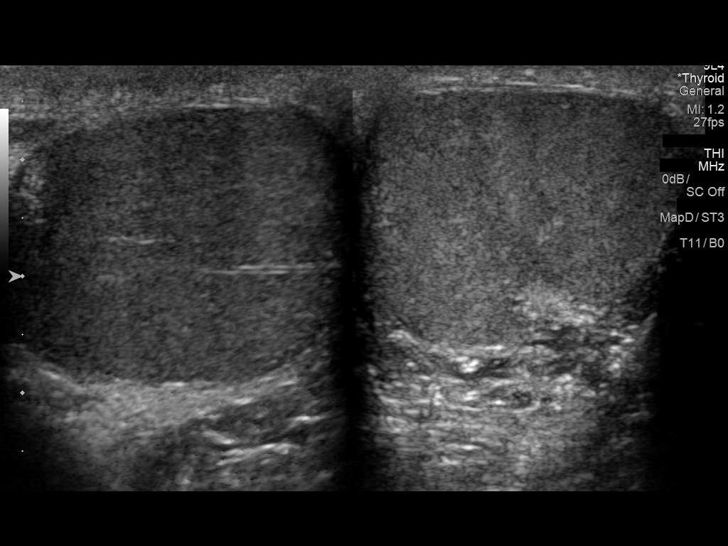
[im 4/41]
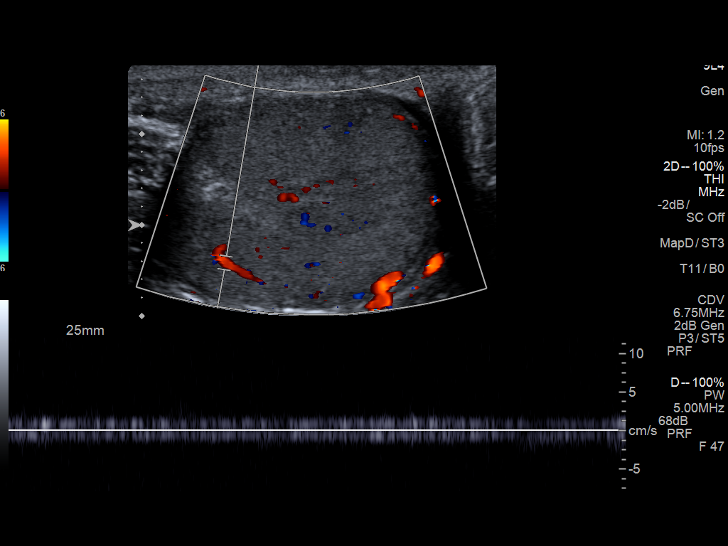
[im 7/41]
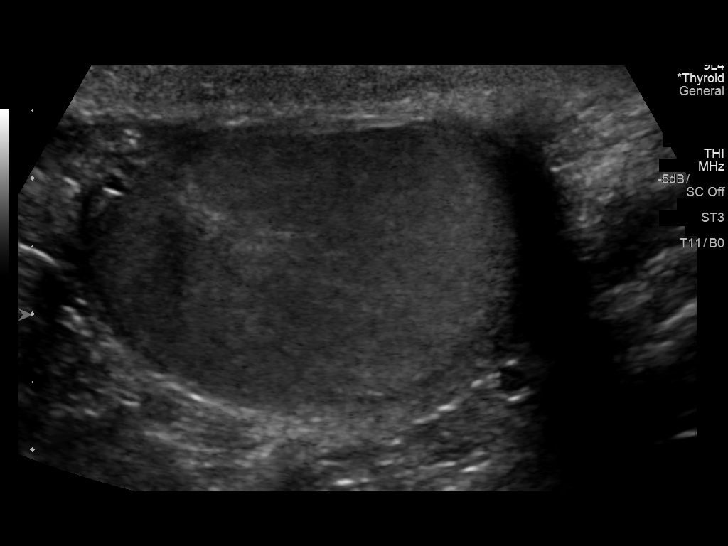
[im 11/41]
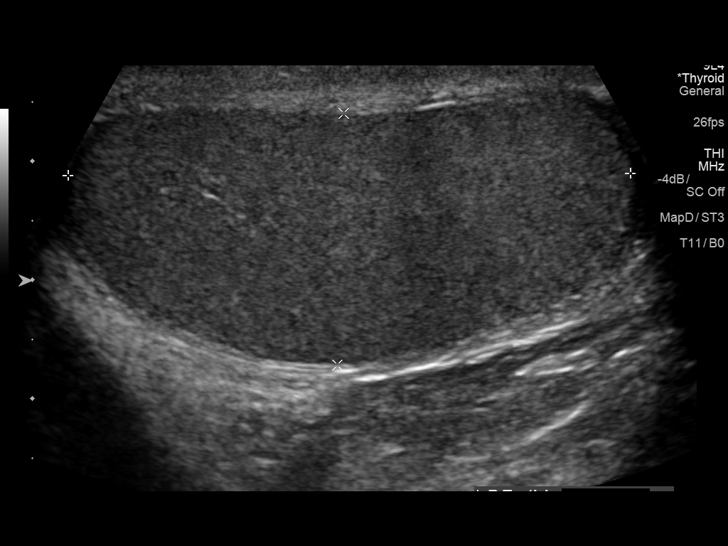
[im 14/41]
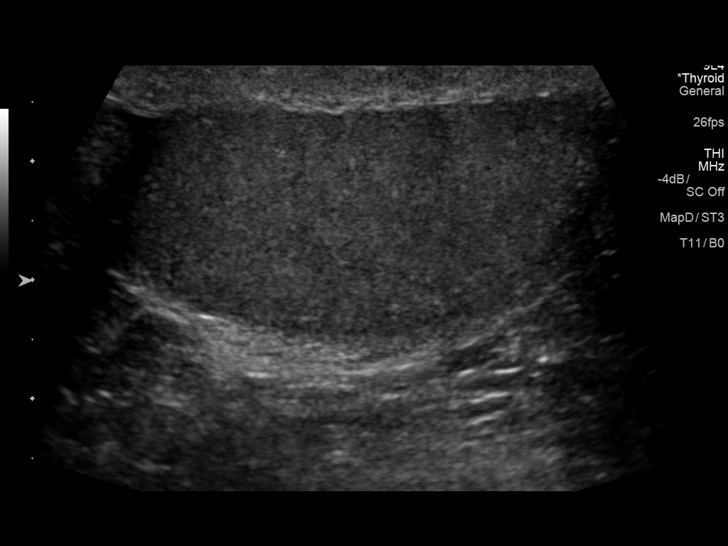
[im 16/41]
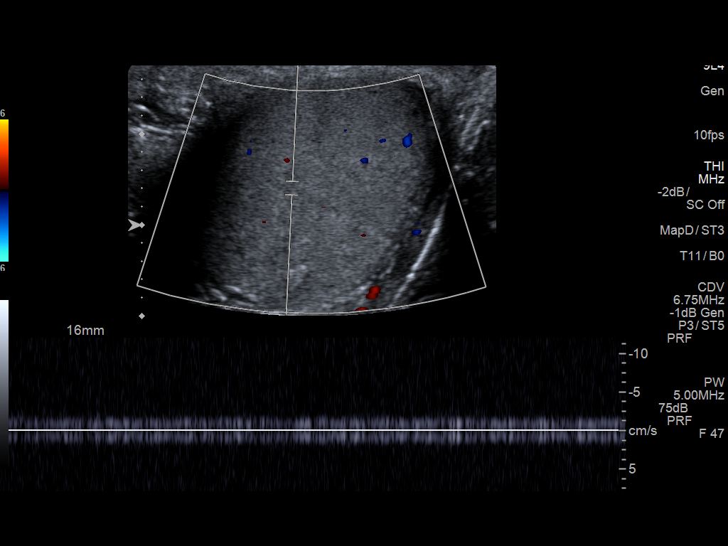
[im 19/41]
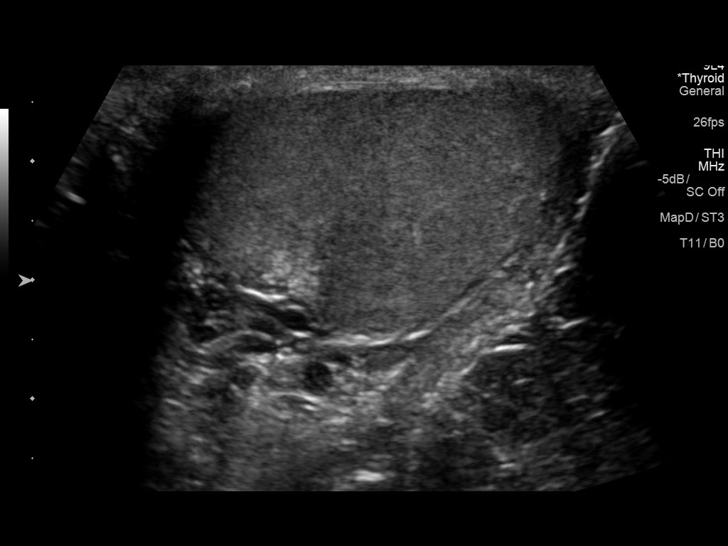
[im 22/41]
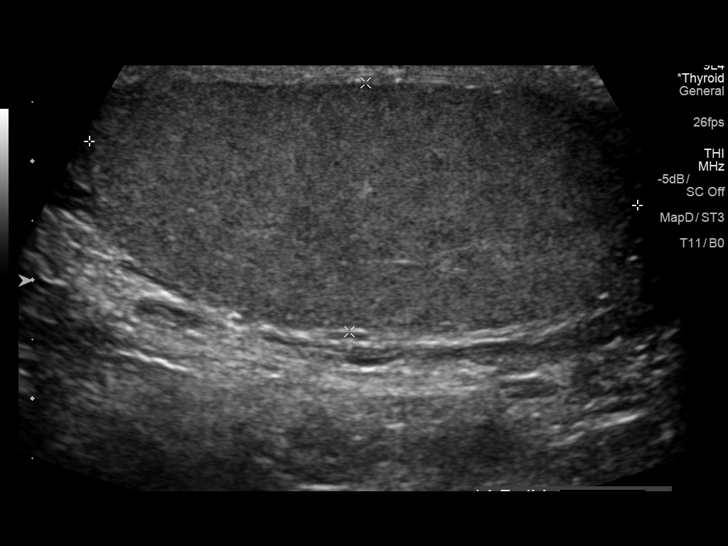
[im 26/41]
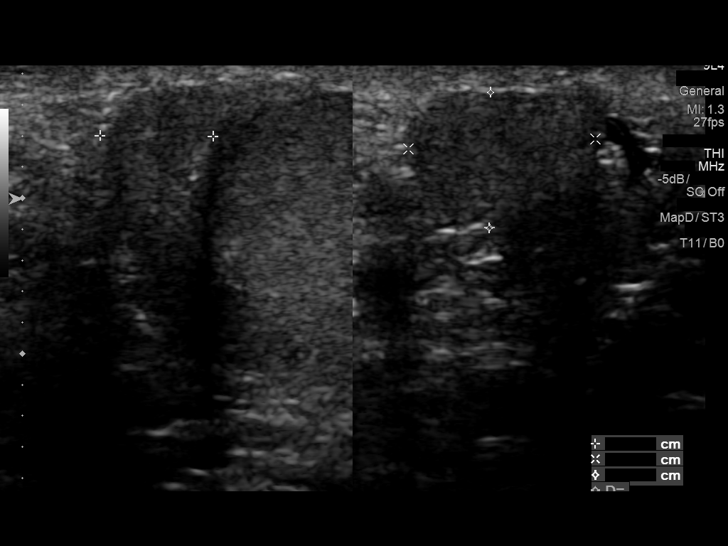
[im 27/41]
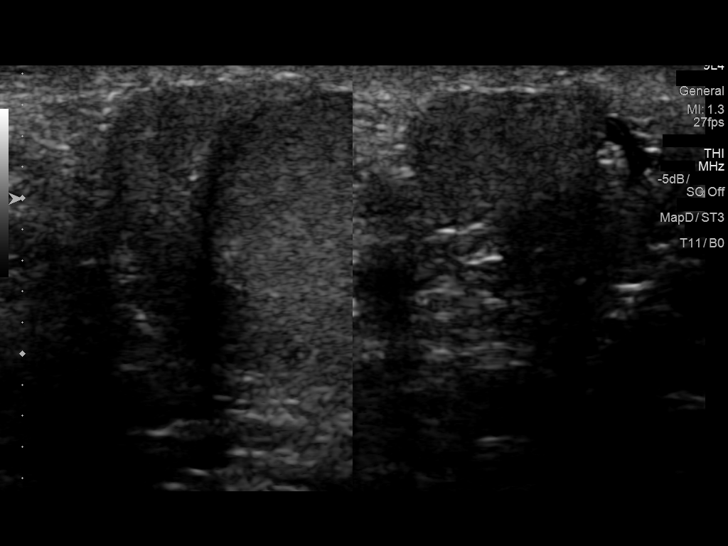
[im 31/41]
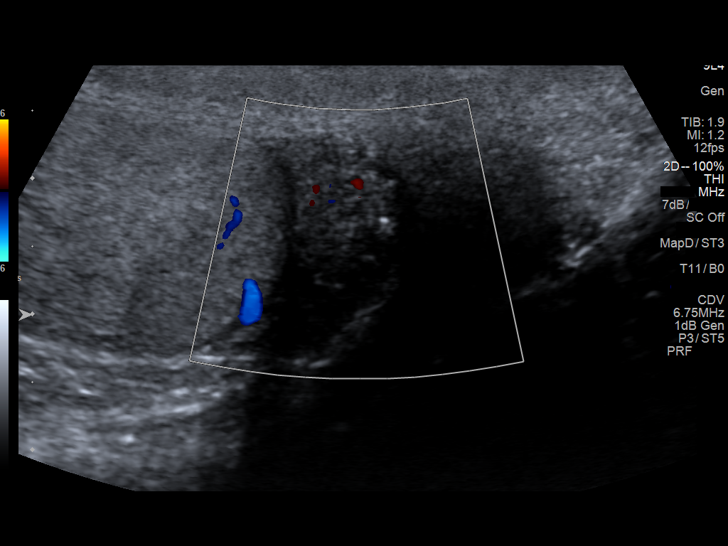
[im 34/41]
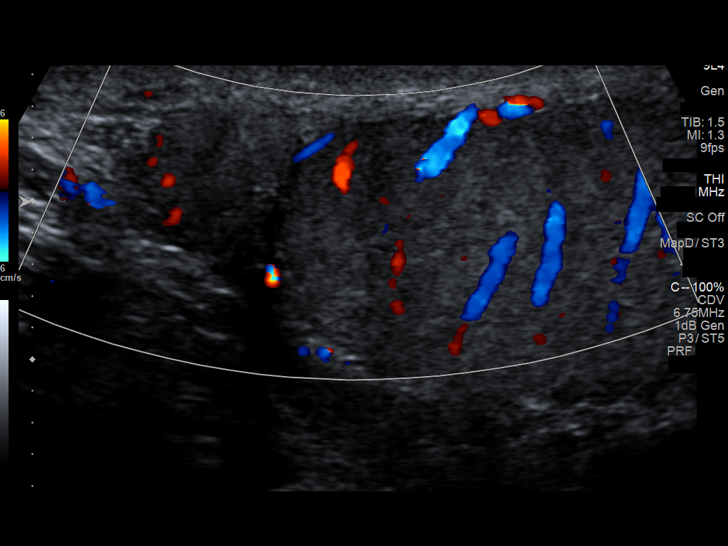
[im 37/41]
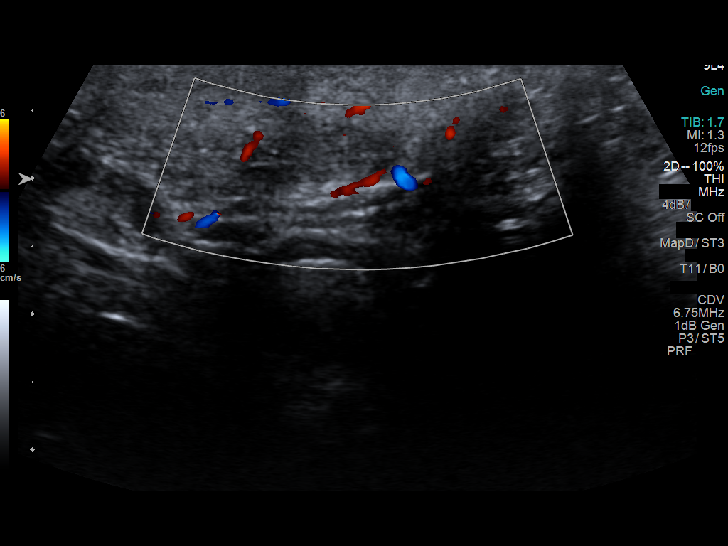
[im 41/41]
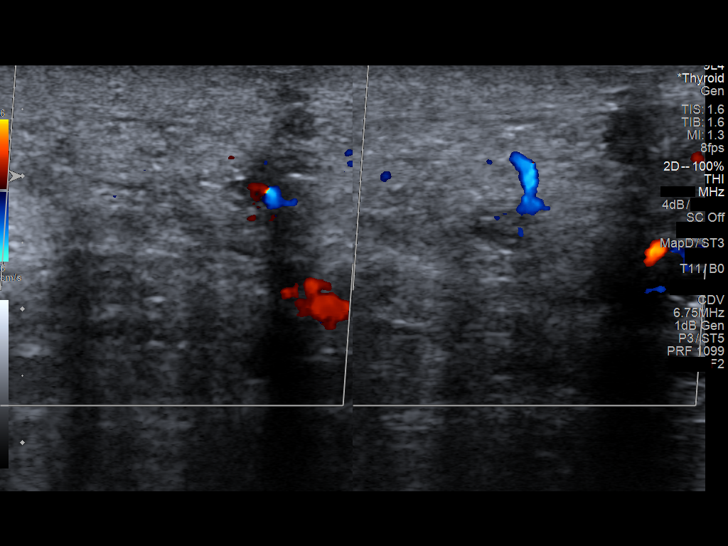

[14 of 25 positions shown; findings below may reference images not displayed]

FINDINGS: Right testicle

Measurements: 4.7 x 2.1 x 3.1 cm. No mass or microlithiasis
visualized.

Left testicle

Measurements: 4.6 x 2.1 x 3.2 cm. No mass or microlithiasis
visualized.

Right epididymis:  Normal in size and appearance.

Left epididymis:  Normal in size and appearance.

Hydrocele:  None visualized.

Varicocele:  None visualized.

Pulsed Doppler interrogation of both testes demonstrates normal low
resistance arterial and venous waveforms bilaterally.
IMPRESSION: Normal study.  No cause for pain identified.
